# Patient Record
Sex: Female | Born: 1955 | Race: White | Hispanic: No | Marital: Married | Smoking: Never smoker
Health system: Southern US, Community
[De-identification: ages and names within clinical notes are randomized; demographics above are authoritative.]

## PROBLEM LIST (undated history)

## (undated) DIAGNOSIS — M8580 Other specified disorders of bone density and structure, unspecified site: Secondary | ICD-10-CM

## (undated) DIAGNOSIS — K219 Gastro-esophageal reflux disease without esophagitis: Secondary | ICD-10-CM

## (undated) DIAGNOSIS — E785 Hyperlipidemia, unspecified: Secondary | ICD-10-CM

## (undated) DIAGNOSIS — H269 Unspecified cataract: Secondary | ICD-10-CM

## (undated) DIAGNOSIS — I10 Essential (primary) hypertension: Secondary | ICD-10-CM

## (undated) DIAGNOSIS — R011 Cardiac murmur, unspecified: Secondary | ICD-10-CM

## (undated) DIAGNOSIS — J302 Other seasonal allergic rhinitis: Secondary | ICD-10-CM

## (undated) HISTORY — DX: Other specified disorders of bone density and structure, unspecified site: M85.80

## (undated) HISTORY — DX: Gastro-esophageal reflux disease without esophagitis: K21.9

## (undated) HISTORY — DX: Cardiac murmur, unspecified: R01.1

## (undated) HISTORY — DX: Other seasonal allergic rhinitis: J30.2

## (undated) HISTORY — PX: TONSILLECTOMY: SHX5217

## (undated) HISTORY — DX: Unspecified cataract: H26.9

## (undated) HISTORY — DX: Essential (primary) hypertension: I10

## (undated) HISTORY — DX: Hyperlipidemia, unspecified: E78.5

---

## 1998-06-07 ENCOUNTER — Ambulatory Visit (HOSPITAL_COMMUNITY): Admission: RE | Admit: 1998-06-07 | Discharge: 1998-06-07 | Payer: Self-pay | Admitting: *Deleted

## 1998-08-16 ENCOUNTER — Other Ambulatory Visit: Admission: RE | Admit: 1998-08-16 | Discharge: 1998-08-16 | Payer: Self-pay | Admitting: *Deleted

## 1999-06-13 ENCOUNTER — Encounter: Payer: Self-pay | Admitting: *Deleted

## 1999-06-13 ENCOUNTER — Ambulatory Visit (HOSPITAL_COMMUNITY): Admission: RE | Admit: 1999-06-13 | Discharge: 1999-06-13 | Payer: Self-pay | Admitting: *Deleted

## 1999-06-16 ENCOUNTER — Encounter: Payer: Self-pay | Admitting: *Deleted

## 1999-06-16 ENCOUNTER — Ambulatory Visit (HOSPITAL_COMMUNITY): Admission: RE | Admit: 1999-06-16 | Discharge: 1999-06-16 | Payer: Self-pay | Admitting: *Deleted

## 1999-10-26 ENCOUNTER — Other Ambulatory Visit: Admission: RE | Admit: 1999-10-26 | Discharge: 1999-10-26 | Payer: Self-pay | Admitting: *Deleted

## 2000-01-02 ENCOUNTER — Ambulatory Visit (HOSPITAL_COMMUNITY): Admission: RE | Admit: 2000-01-02 | Discharge: 2000-01-02 | Payer: Self-pay | Admitting: Family Medicine

## 2000-01-02 ENCOUNTER — Encounter: Payer: Self-pay | Admitting: Family Medicine

## 2000-06-22 ENCOUNTER — Encounter: Payer: Self-pay | Admitting: *Deleted

## 2000-06-22 ENCOUNTER — Ambulatory Visit (HOSPITAL_COMMUNITY): Admission: RE | Admit: 2000-06-22 | Discharge: 2000-06-22 | Payer: Self-pay | Admitting: *Deleted

## 2000-10-25 ENCOUNTER — Other Ambulatory Visit: Admission: RE | Admit: 2000-10-25 | Discharge: 2000-10-25 | Payer: Self-pay | Admitting: *Deleted

## 2000-11-13 HISTORY — PX: VAGINAL HYSTERECTOMY: SHX2639

## 2001-07-02 ENCOUNTER — Encounter: Payer: Self-pay | Admitting: *Deleted

## 2001-07-02 ENCOUNTER — Ambulatory Visit (HOSPITAL_COMMUNITY): Admission: RE | Admit: 2001-07-02 | Discharge: 2001-07-02 | Payer: Self-pay | Admitting: *Deleted

## 2001-10-28 ENCOUNTER — Other Ambulatory Visit: Admission: RE | Admit: 2001-10-28 | Discharge: 2001-10-28 | Payer: Self-pay | Admitting: Obstetrics and Gynecology

## 2002-06-05 ENCOUNTER — Ambulatory Visit (HOSPITAL_COMMUNITY): Admission: RE | Admit: 2002-06-05 | Discharge: 2002-06-05 | Payer: Self-pay | Admitting: Obstetrics and Gynecology

## 2002-06-05 ENCOUNTER — Encounter: Payer: Self-pay | Admitting: Obstetrics and Gynecology

## 2002-06-17 ENCOUNTER — Observation Stay (HOSPITAL_COMMUNITY): Admission: RE | Admit: 2002-06-17 | Discharge: 2002-06-18 | Payer: Self-pay | Admitting: Obstetrics and Gynecology

## 2002-06-17 ENCOUNTER — Encounter (INDEPENDENT_AMBULATORY_CARE_PROVIDER_SITE_OTHER): Payer: Self-pay | Admitting: Specialist

## 2002-07-09 ENCOUNTER — Encounter: Payer: Self-pay | Admitting: *Deleted

## 2002-07-09 ENCOUNTER — Ambulatory Visit (HOSPITAL_COMMUNITY): Admission: RE | Admit: 2002-07-09 | Discharge: 2002-07-09 | Payer: Self-pay | Admitting: *Deleted

## 2003-04-22 ENCOUNTER — Emergency Department (HOSPITAL_COMMUNITY): Admission: EM | Admit: 2003-04-22 | Discharge: 2003-04-23 | Payer: Self-pay | Admitting: Emergency Medicine

## 2003-07-22 ENCOUNTER — Encounter: Payer: Self-pay | Admitting: *Deleted

## 2003-07-22 ENCOUNTER — Ambulatory Visit (HOSPITAL_COMMUNITY): Admission: RE | Admit: 2003-07-22 | Discharge: 2003-07-22 | Payer: Self-pay | Admitting: *Deleted

## 2004-04-27 ENCOUNTER — Emergency Department (HOSPITAL_COMMUNITY): Admission: EM | Admit: 2004-04-27 | Discharge: 2004-04-27 | Payer: Self-pay | Admitting: Family Medicine

## 2004-04-27 IMAGING — CR DG FOOT COMPLETE 3+V*L*
3 series · 3 of 3 positions shown · non-contrast
Comparison: none

CLINICAL DATA: Foot pain.
 LEFT FOOT, THREE-VIEW

[view not recorded (1 of 3)]
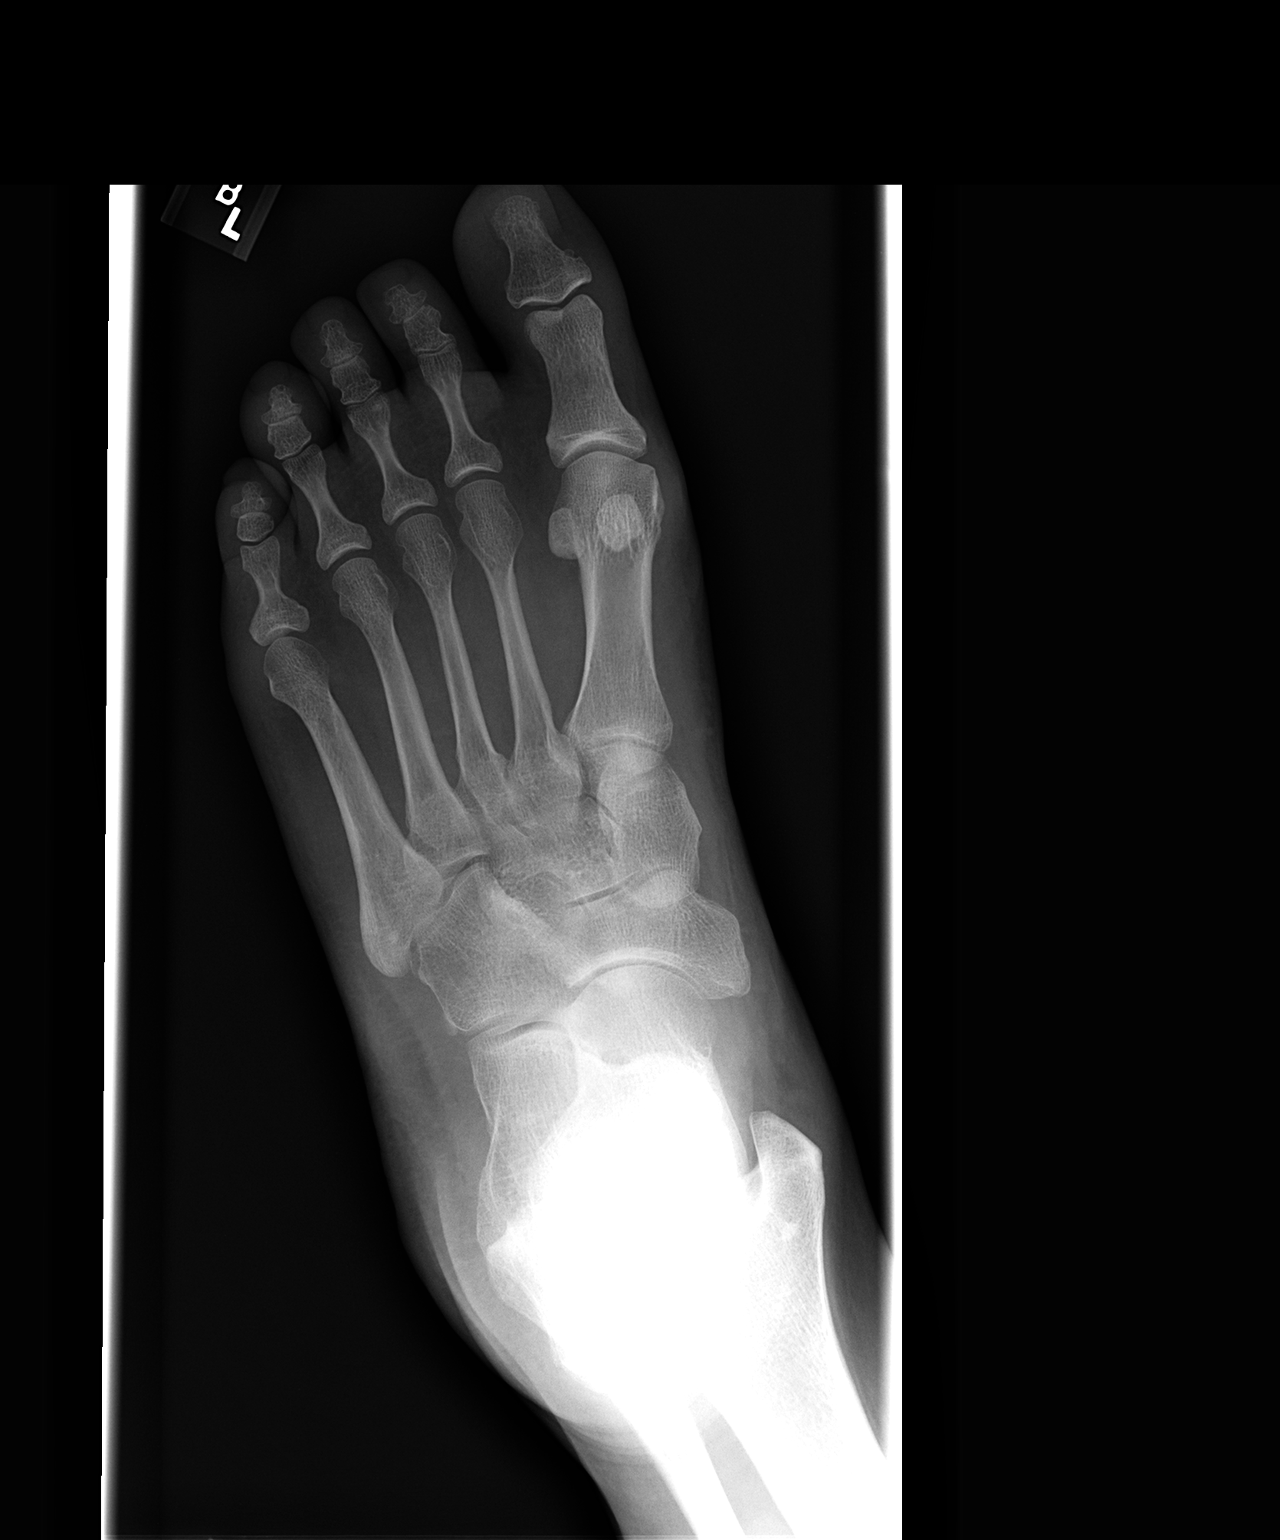

[view not recorded (2 of 3)]
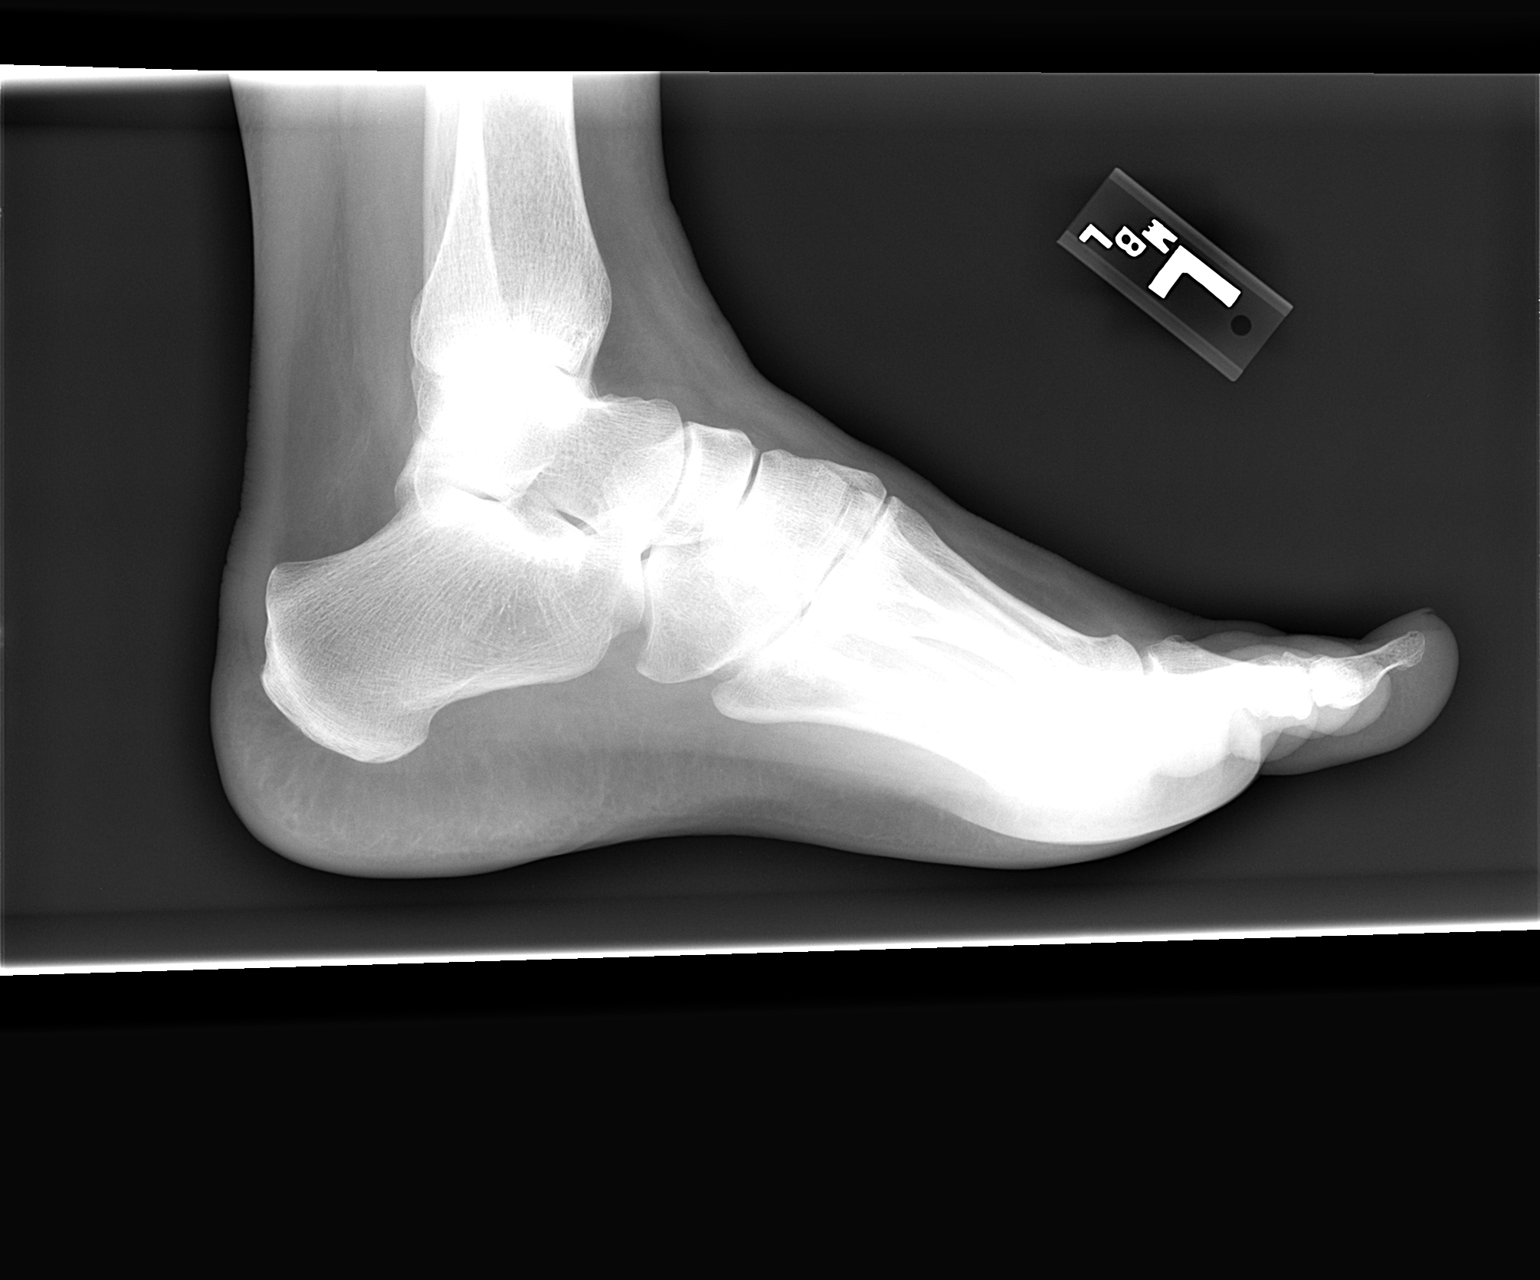

[view not recorded (3 of 3)]
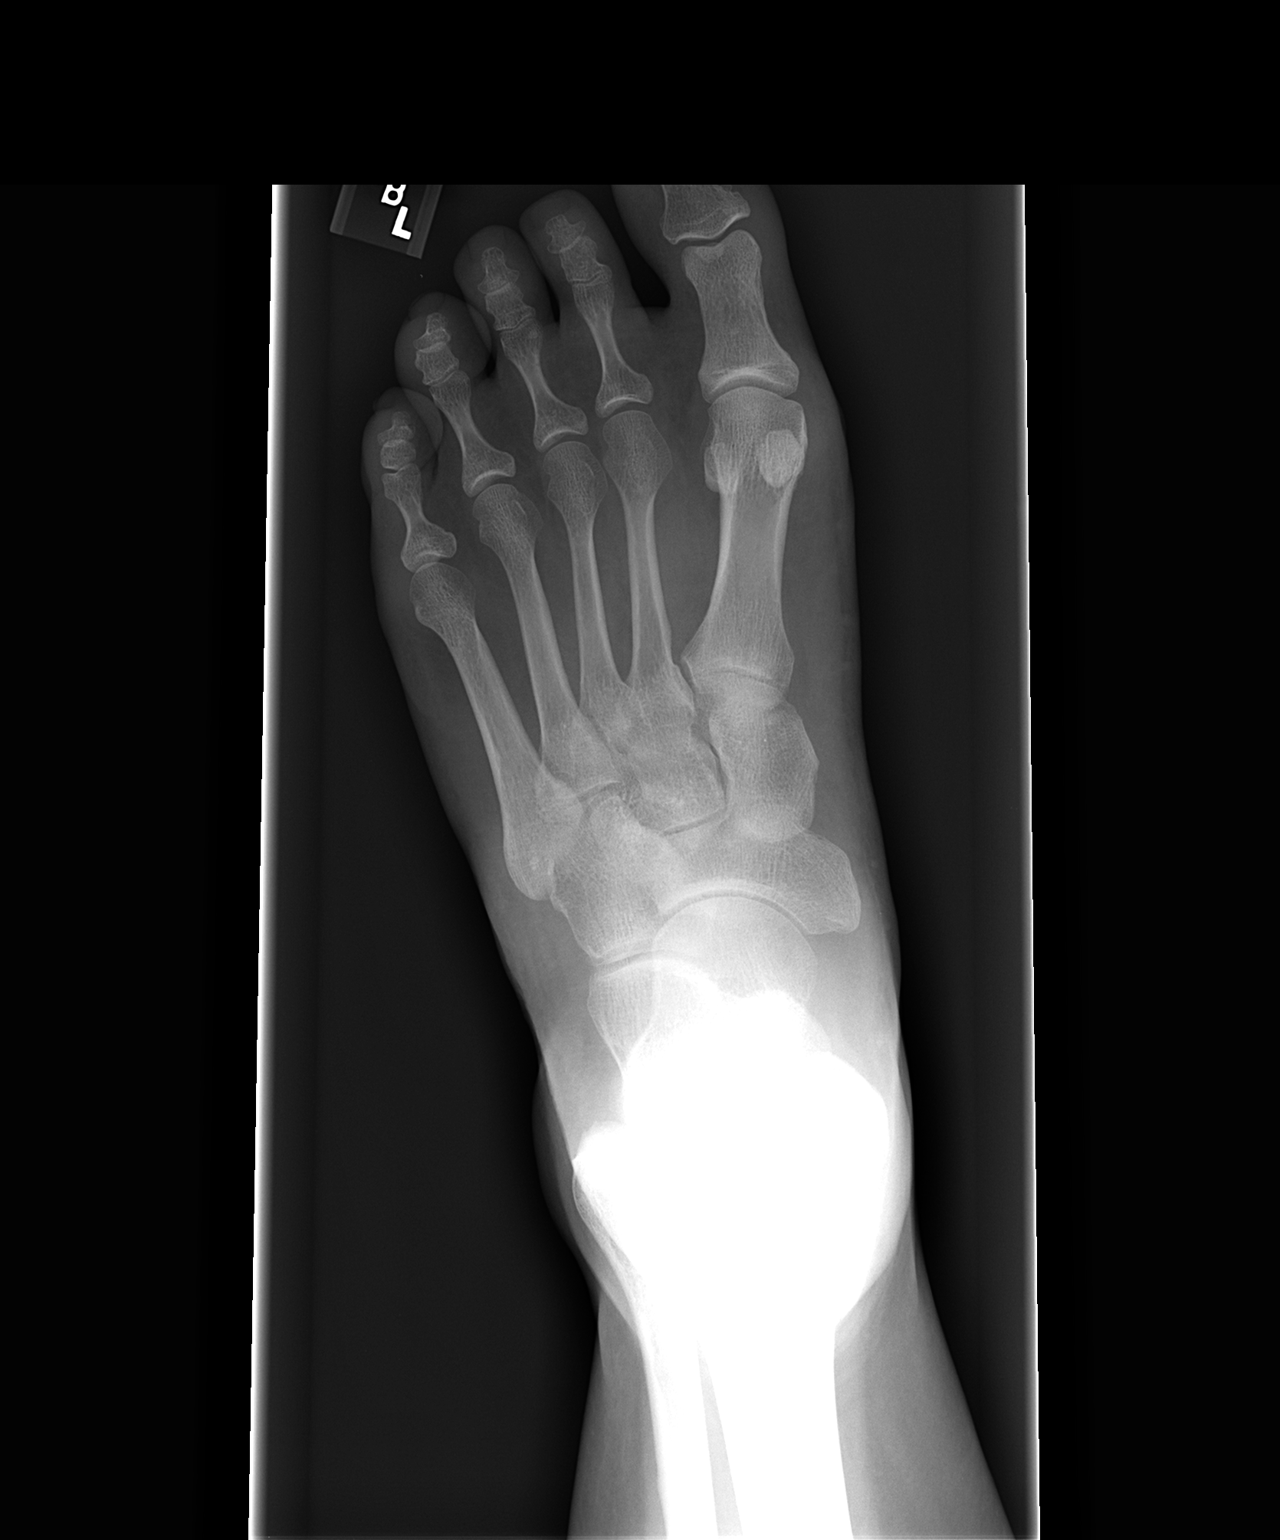

[3 of 3 positions shown; findings below may reference images not displayed]

FINDINGS: There is no evidence of fracture or dislocation.  No other significant bone or soft tissue abnormalities are identified.  The joint spaces are within normal limits.
 IMPRESSION
 Normal study.

## 2004-08-05 ENCOUNTER — Ambulatory Visit (HOSPITAL_COMMUNITY): Admission: RE | Admit: 2004-08-05 | Discharge: 2004-08-05 | Payer: Self-pay | Admitting: *Deleted

## 2005-08-31 ENCOUNTER — Ambulatory Visit (HOSPITAL_COMMUNITY): Admission: RE | Admit: 2005-08-31 | Discharge: 2005-08-31 | Payer: Self-pay | Admitting: *Deleted

## 2005-12-12 ENCOUNTER — Other Ambulatory Visit: Admission: RE | Admit: 2005-12-12 | Discharge: 2005-12-12 | Payer: Self-pay | Admitting: Family Medicine

## 2006-08-30 ENCOUNTER — Ambulatory Visit (HOSPITAL_COMMUNITY): Admission: RE | Admit: 2006-08-30 | Discharge: 2006-08-30 | Payer: Self-pay | Admitting: Family Medicine

## 2006-08-30 IMAGING — MG MM DIGITAL SCREENING BILAT
4 series · 4 of 4 positions shown · non-contrast
Comparison: none

DG SCREEN MAMMOGRAM BILATERAL
Bilateral CC and MLO view(s) were taken.

DIGITAL SCREENING MAMMOGRAM WITH CAD:
There is a dense fibroglandular pattern.  A possible mass is noted in the right breast.  Spot 
compression views and possibly sonography are recommended for further evaluation.  In the left 
breast, no masses or malignant type calcifications are identified.  Compared with prior studies.

[R CC]
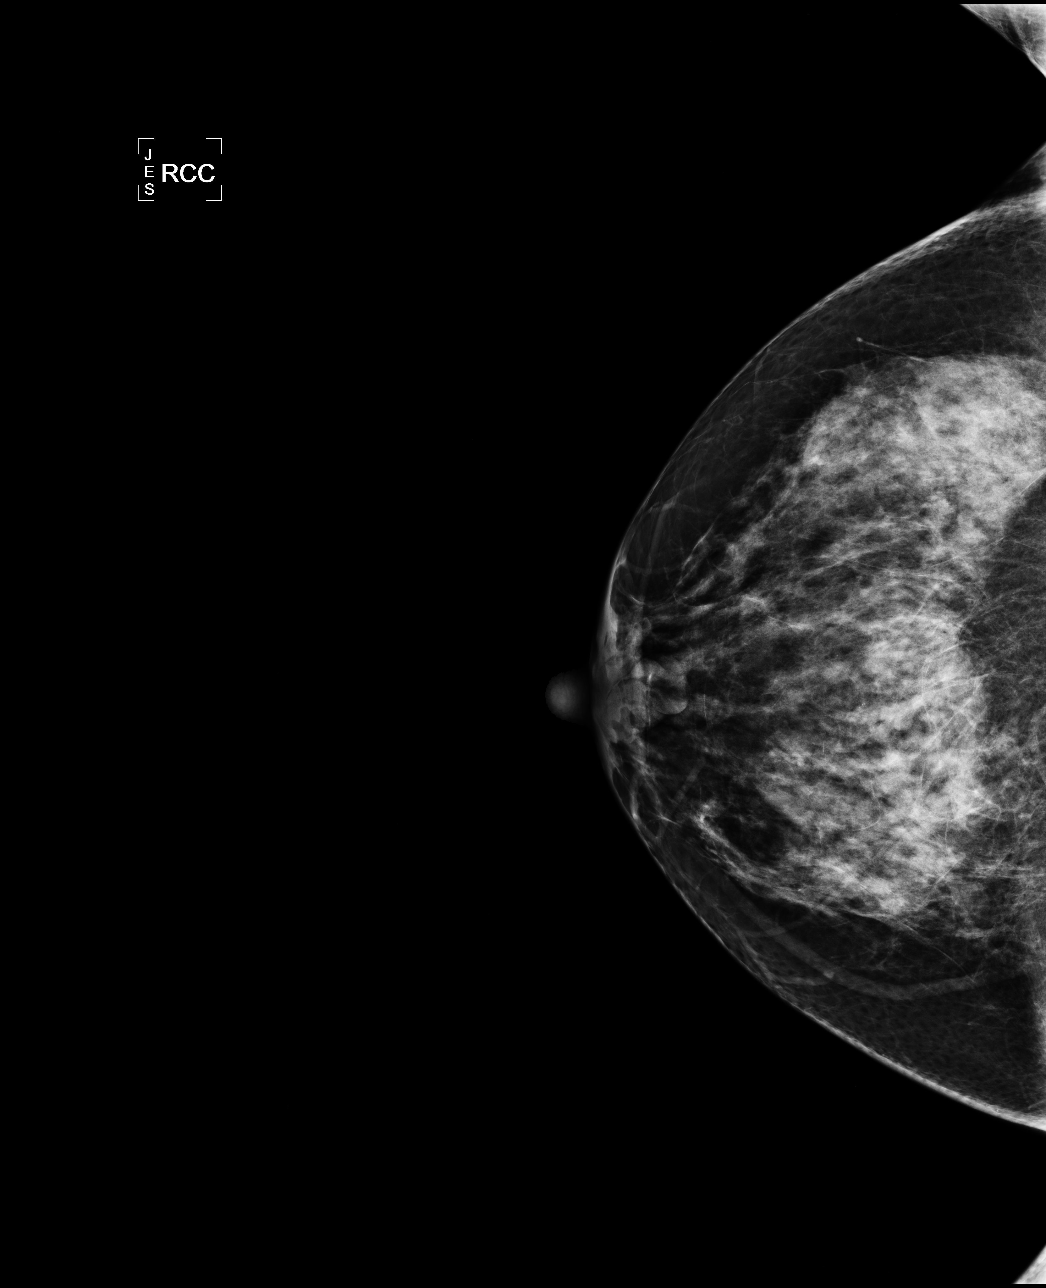

[R MLO]
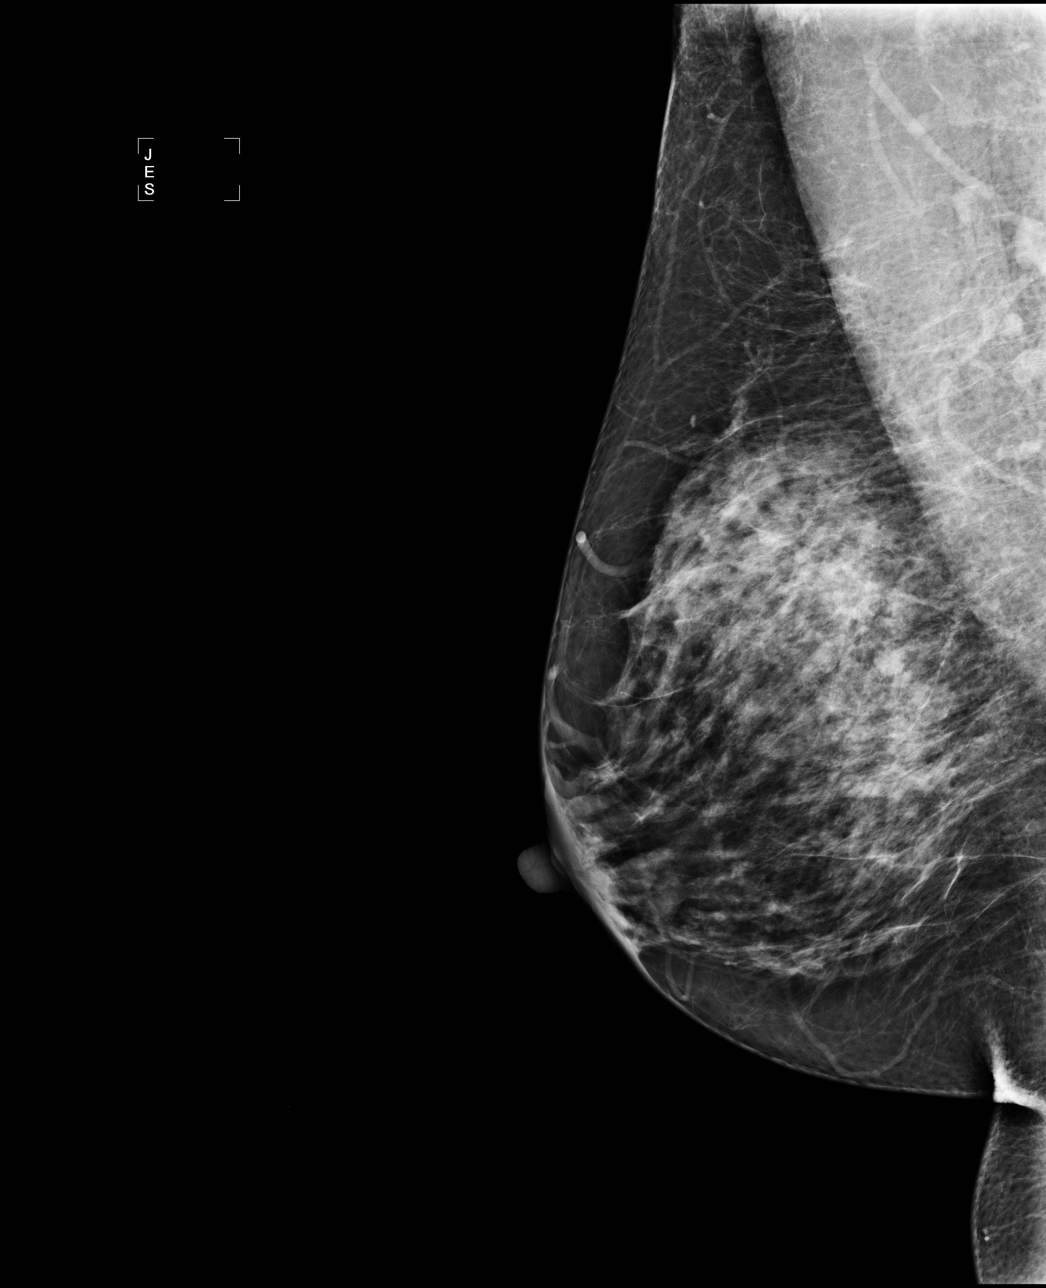

[L CC]
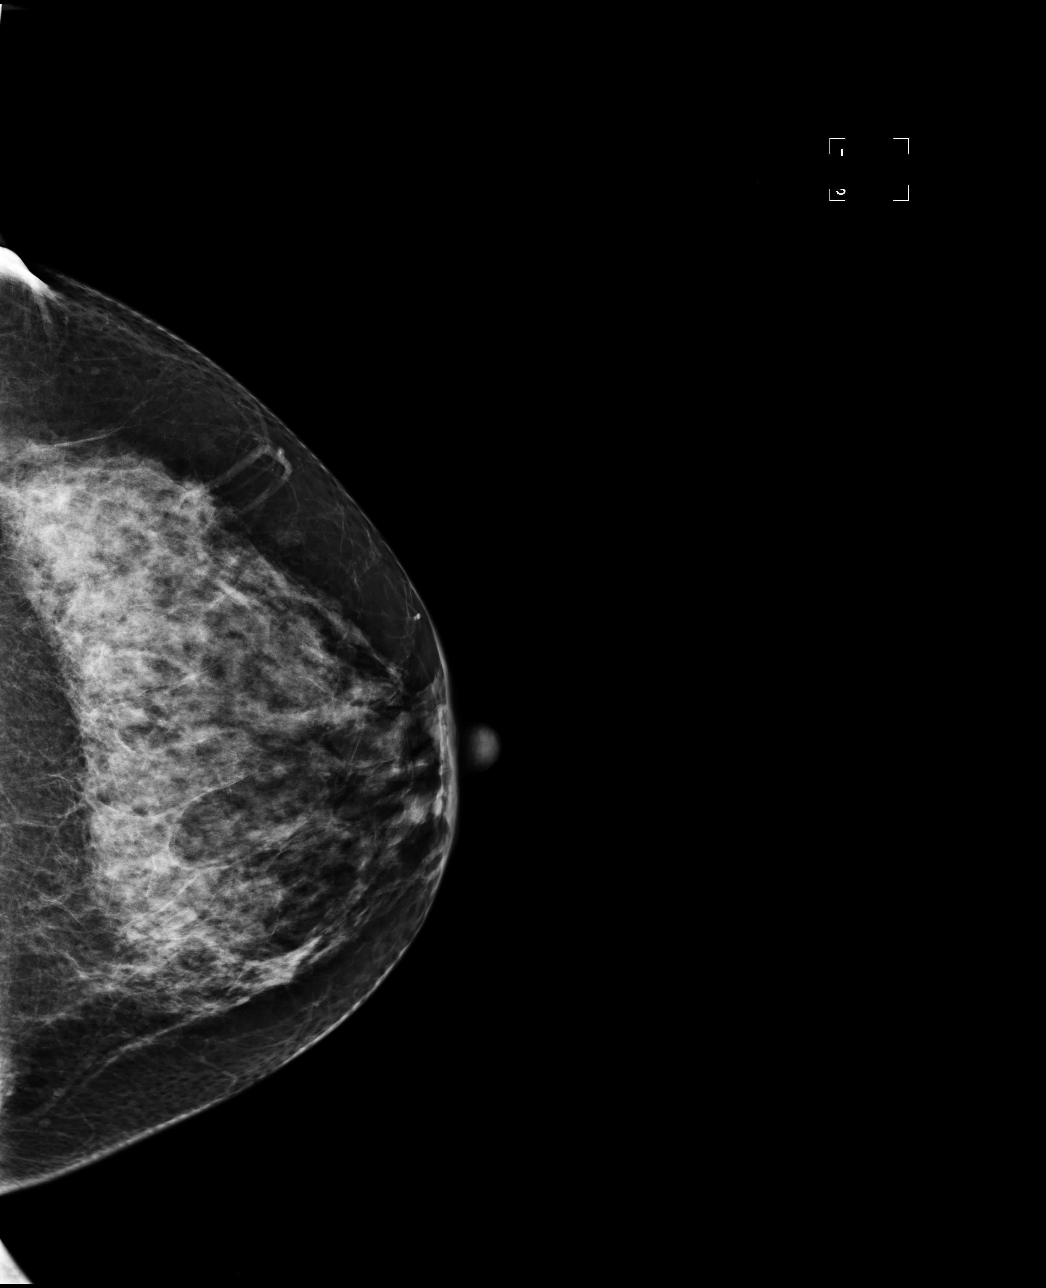

[L MLO]
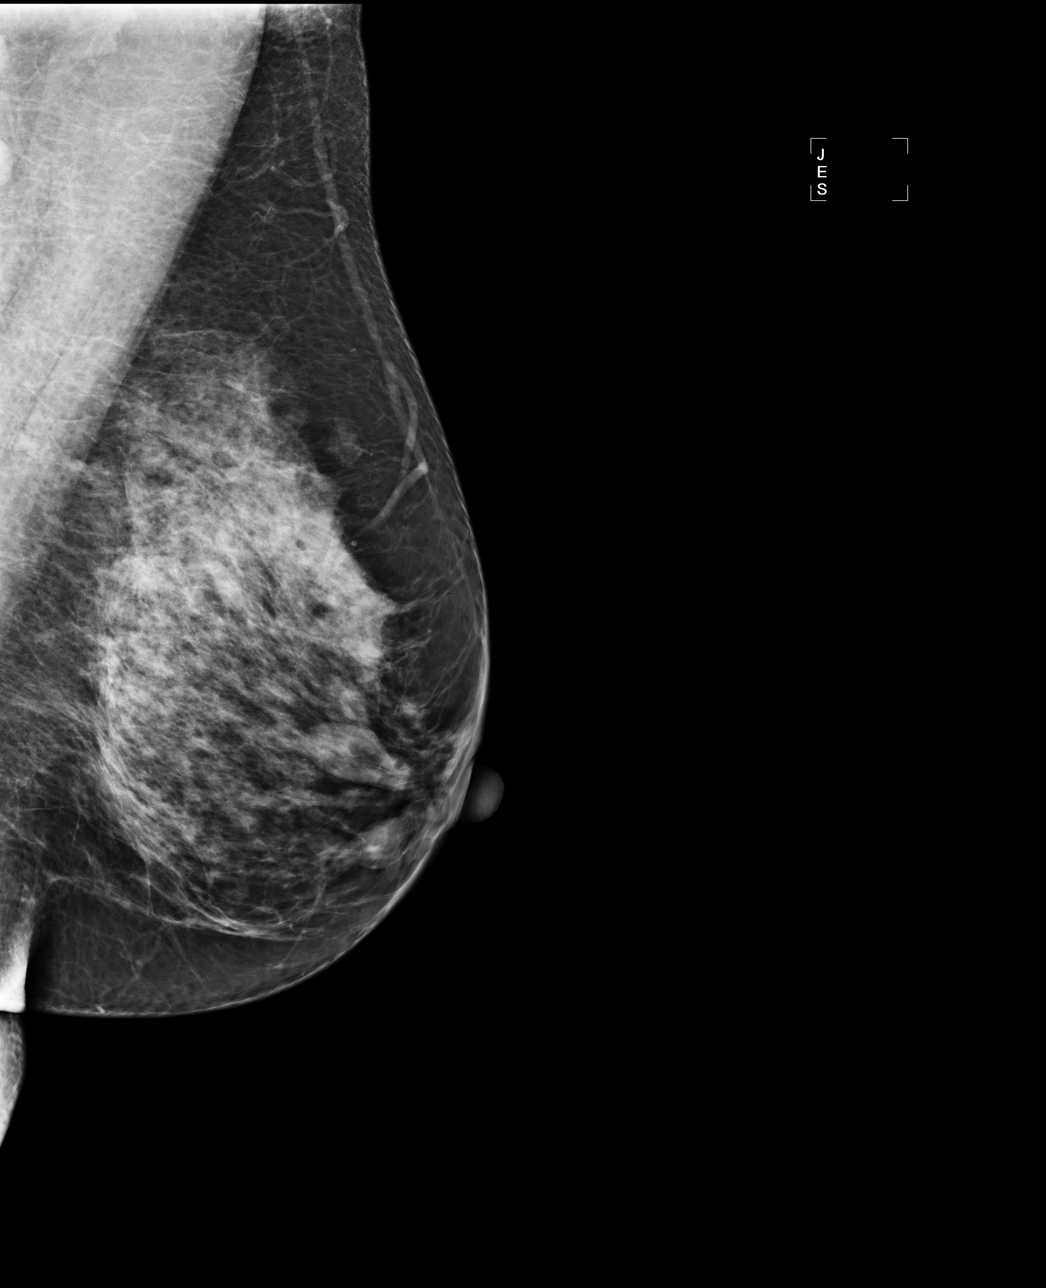

[4 of 4 positions shown; findings below may reference images not displayed]

IMPRESSION: Possible mass, right breast.  Additional evaluation is indicated.  The patient will be contacted 
for additional studies and a supplementary report will follow.  No specific mammographic evidence 
of malignancy, left breast.

ASSESSMENT: Need additional imaging evaluation and/or prior mammograms for comparison - BI-RADS 0

Further imaging of the right breast.
ANALYZED BY COMPUTER AIDED DETECTION. , THIS PROCEDURE WAS A DIGITAL MAMMOGRAM.

## 2006-09-13 ENCOUNTER — Encounter: Admission: RE | Admit: 2006-09-13 | Discharge: 2006-09-13 | Payer: Self-pay | Admitting: Family Medicine

## 2006-09-13 IMAGING — MG MM DIAGNOSTIC LTD RIGHT
2 series · 2 of 2 positions shown · non-contrast
Comparison: none

[REDACTED] RIGHT
CC and MLO view(s) were taken of the right breast.

DIGITAL LIMITED RIGHT DIAGNOSTIC MAMMOGRAM:
CLINICAL DATA: Abnormal screening mammogram; dense tissue and strong family history of breast 
cancer in the patient's mother at age 52 and sister at age 46.
Comparison studies are dated [DATE] and [DATE].
Additional views reveal no persistent mass or distortion in the upper inner right breast. The dense
parenchymal pattern is stable.

[R CC]
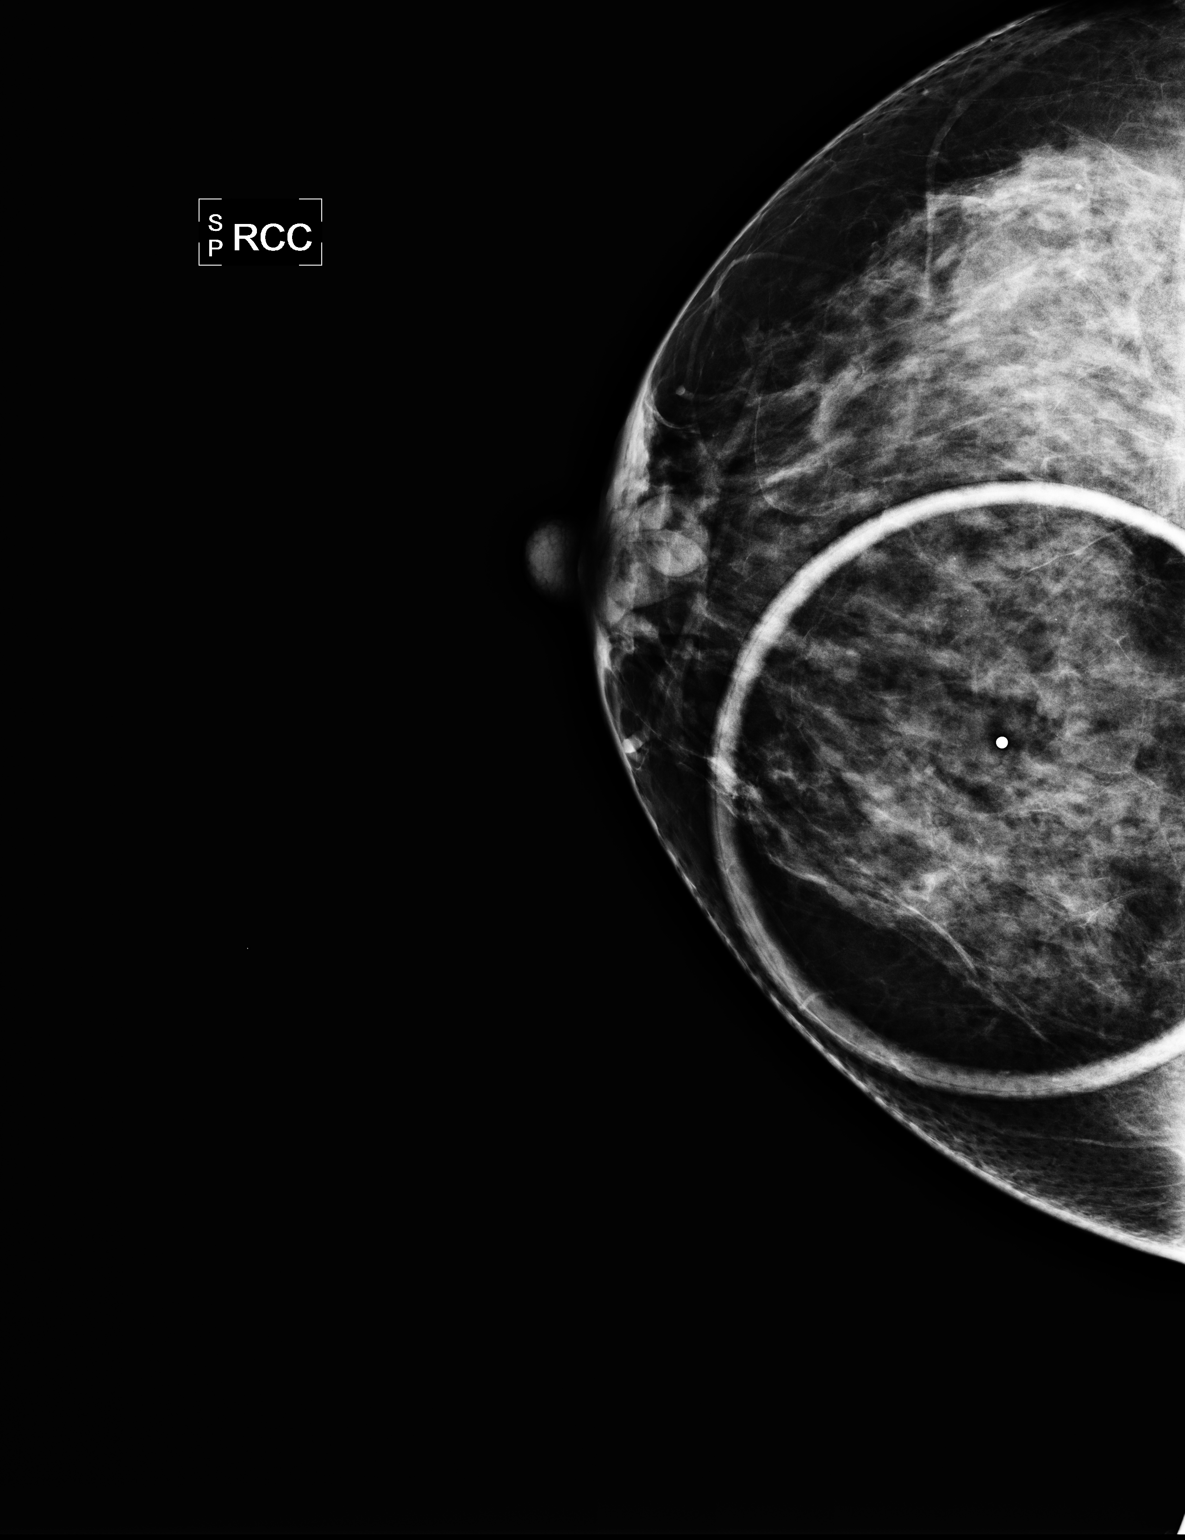

[R MLO]
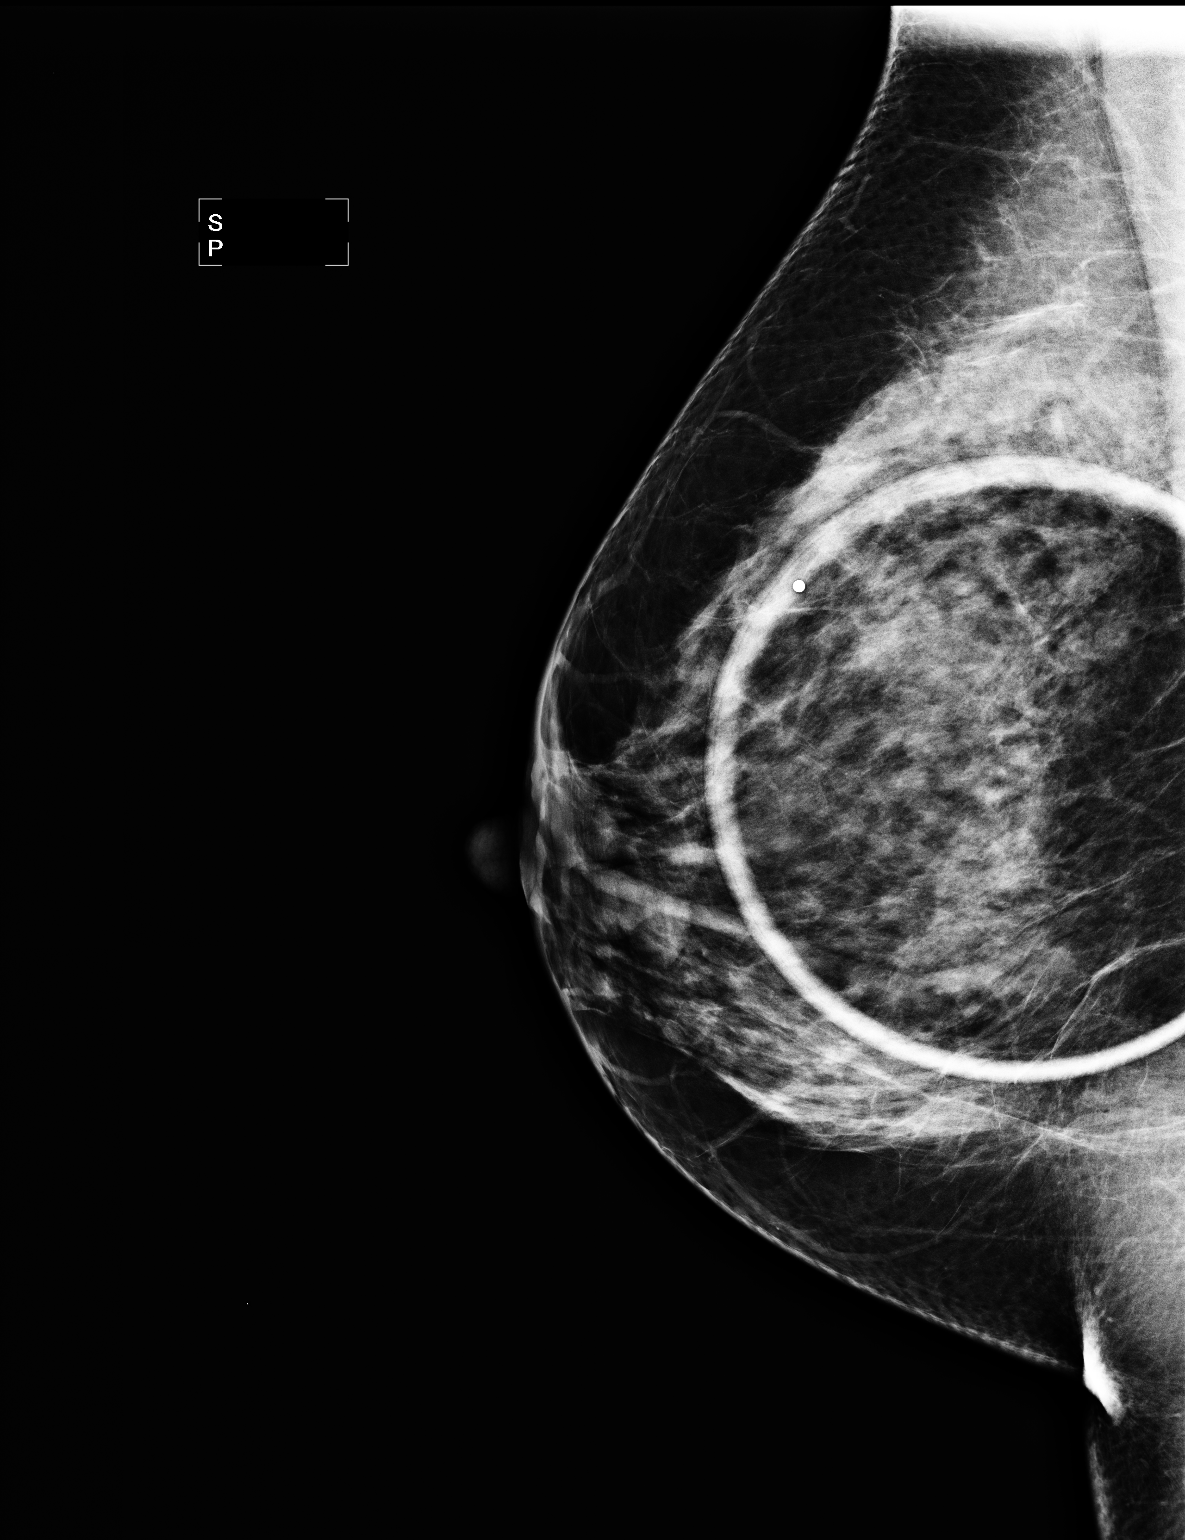

[2 of 2 positions shown; findings below may reference images not displayed]

IMPRESSION: There is no specific radiographic evidence for malignancy on the right. Screening mammogram in one 
year is recommended. Additional screening with MRI every one-two years should also be considered 
because of the strong family history of breast cancer and extremely dense tissue pattern.

ASSESSMENT: Benign - BI-RADS 2

Screening mammogram of both breasts in 1 year.
, THIS PROCEDURE WAS A DIGITAL MAMMOGRAM.

## 2007-09-24 ENCOUNTER — Ambulatory Visit (HOSPITAL_COMMUNITY): Admission: RE | Admit: 2007-09-24 | Discharge: 2007-09-24 | Payer: Self-pay | Admitting: Family Medicine

## 2008-09-24 ENCOUNTER — Ambulatory Visit (HOSPITAL_COMMUNITY): Admission: RE | Admit: 2008-09-24 | Discharge: 2008-09-24 | Payer: Self-pay | Admitting: Family Medicine

## 2008-09-24 IMAGING — MG MM DIGITAL SCREENING BILAT
4 series · 4 of 4 positions shown · non-contrast
Comparison: none

DG SCREEN MAMMOGRAM BILATERAL
Bilateral CC and MLO view(s) were taken.
Technologist: SERPA.(SERPA)(M)

DIGITAL SCREENING MAMMOGRAM WITH CAD:
The breast tissue is extremely dense.  No masses or malignant type calcifications are identified.  
Compared with prior studies.

[R CC]
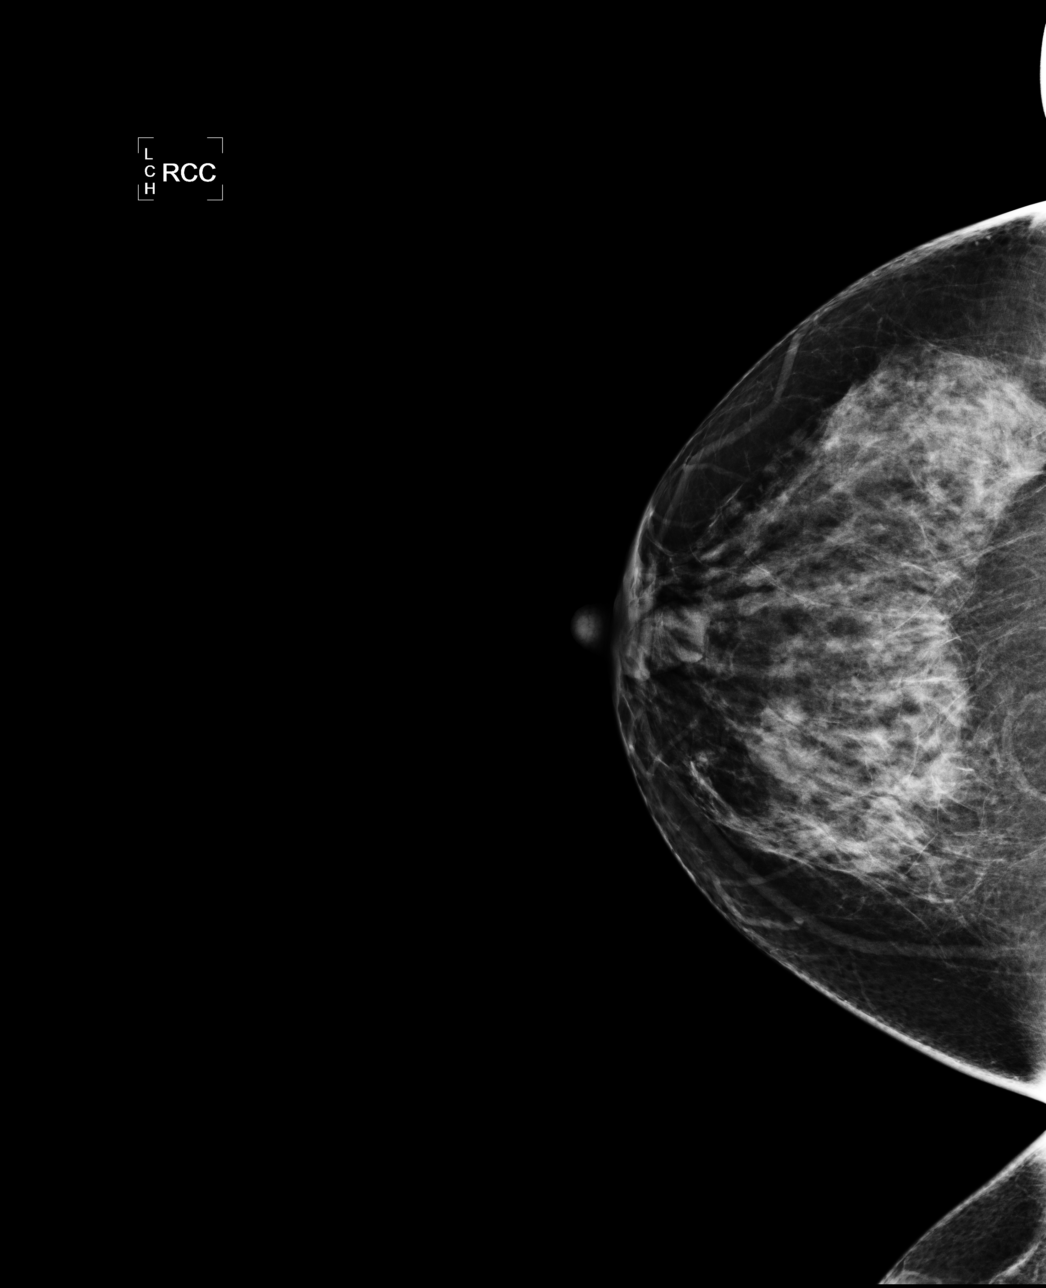

[R MLO]
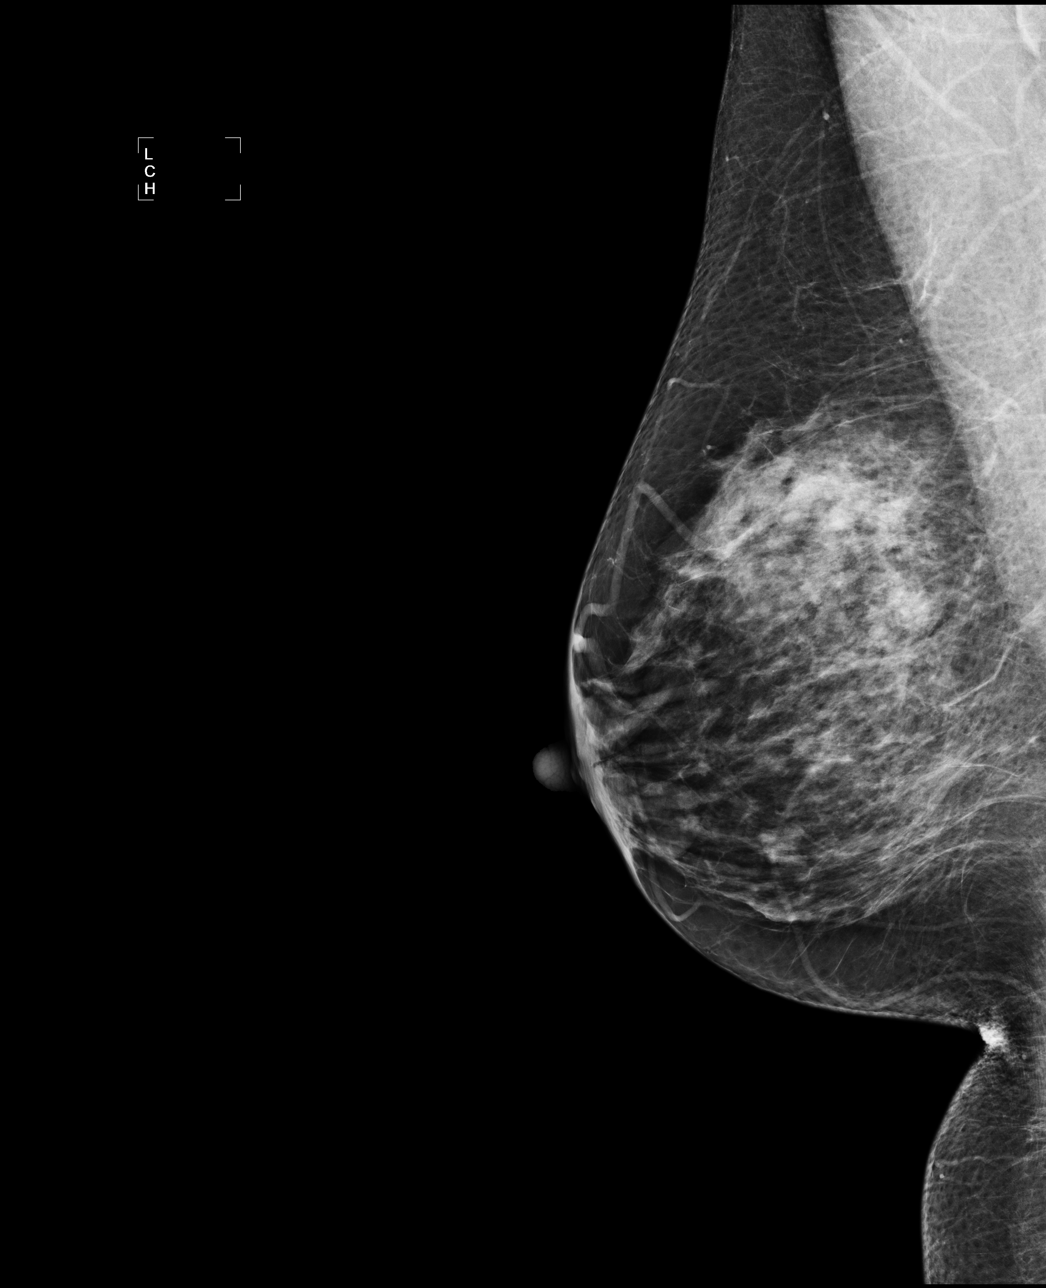

[L CC]
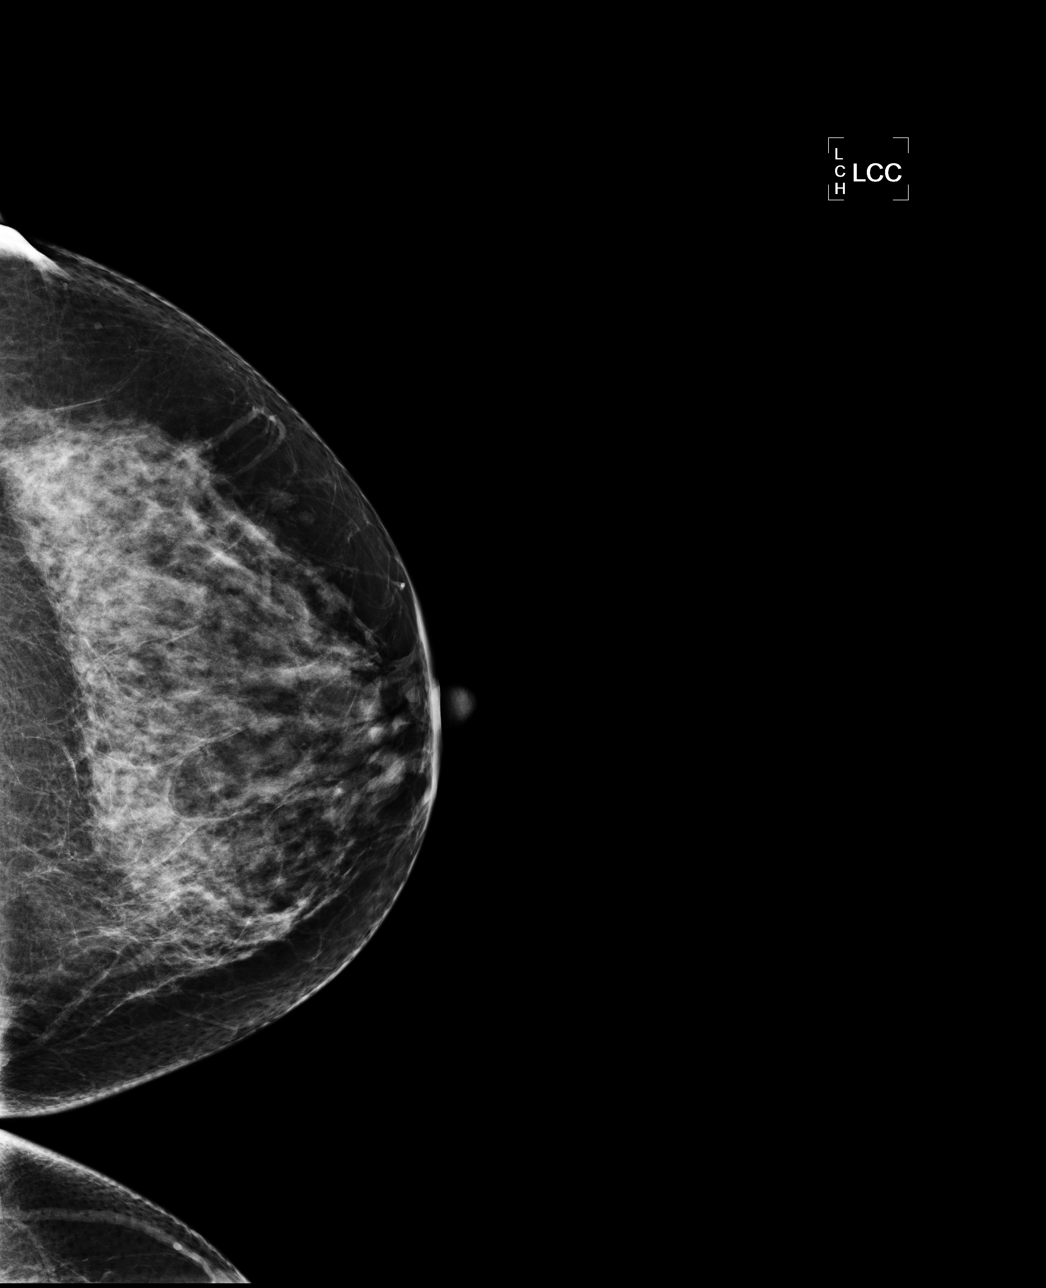

[L MLO]
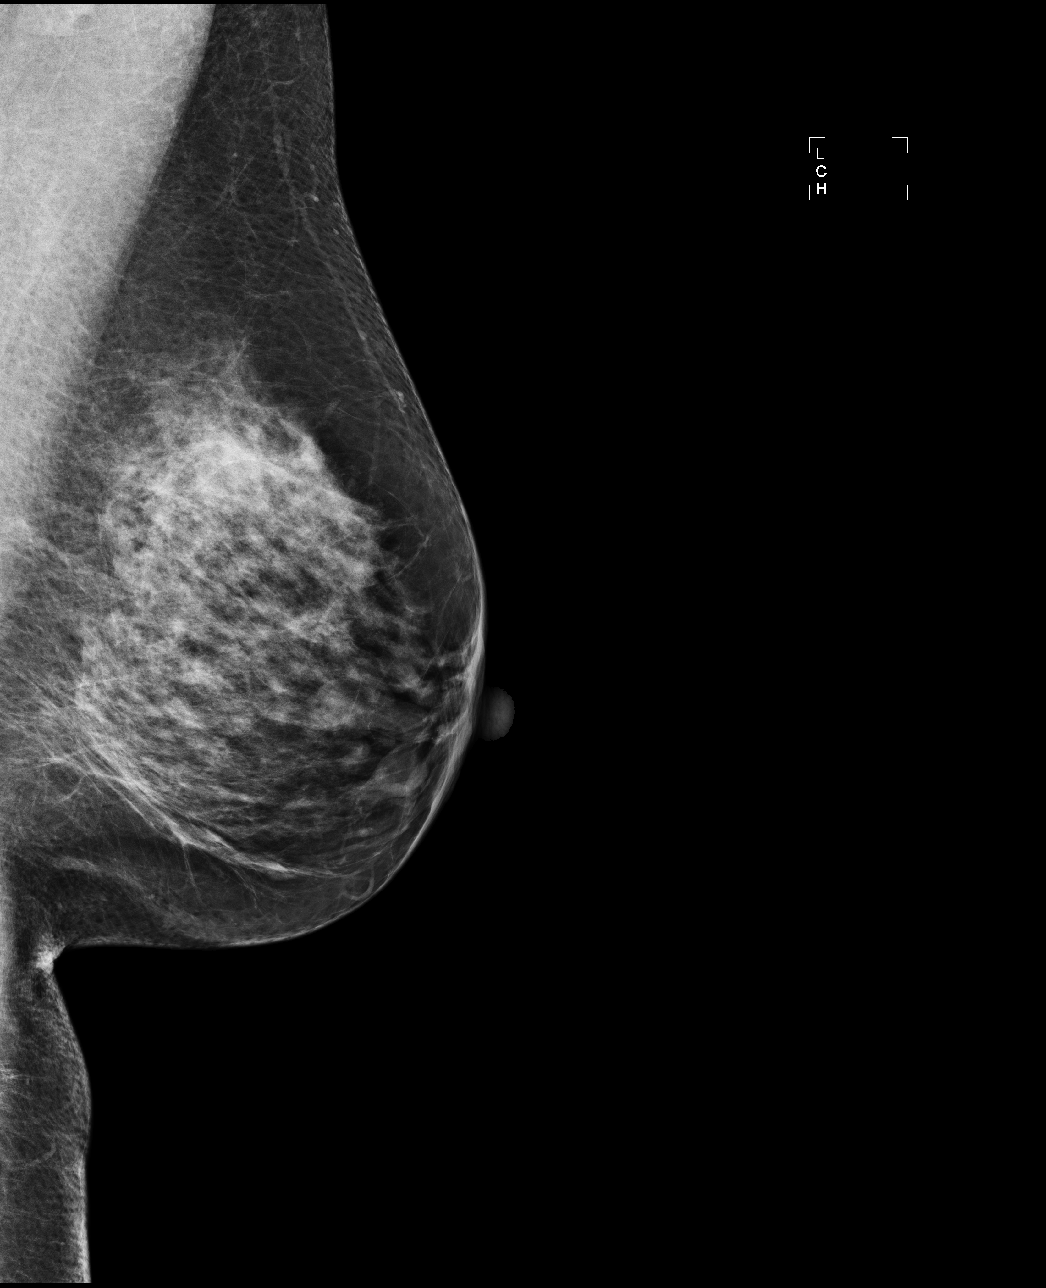

[4 of 4 positions shown; findings below may reference images not displayed]

IMPRESSION: No specific mammographic evidence of malignancy.  Next screening mammogram is recommended in one 
year.

ASSESSMENT: Negative - BI-RADS 1

Screening mammogram in 1 year.
ANALYZED BY COMPUTER AIDED DETECTION. , THIS PROCEDURE WAS A DIGITAL MAMMOGRAM.

## 2010-11-13 HISTORY — PX: CHOLECYSTECTOMY: SHX55

## 2010-12-04 ENCOUNTER — Encounter: Payer: Self-pay | Admitting: Family Medicine

## 2011-03-31 NOTE — Op Note (Signed)
[name], Carolyn                       ACCOUNT NO.:  [id number]   MEDICAL RECORD NO.:  [id number]                   PATIENT TYPE:  OUT   LOCATION:  XRAY                                 FACILITY:  [facility]   PHYSICIAN:  Laqueta Linden, M.D.                 DATE OF BIRTH:  07-Apr-1956   DATE OF PROCEDURE:  06/17/2002  DATE OF DISCHARGE:  06/05/2002                                 OPERATIVE REPORT   PREOPERATIVE DIAGNOSES:  1. Intramural fibroids.  2. Menorrhagia and dysmenorrhea.   POSTOPERATIVE DIAGNOSES:  1. Intramural fibroids.  2. Menorrhagia and dysmenorrhea.  3. Mild serosal endometriosis.   PROCEDURE:  Transvaginal hysterectomy.   SURGEON:  Laqueta Linden, M.D.   ASSISTANT:  Andres Ege, M.D.   ANESTHESIA:  General endotracheal.   ESTIMATED BLOOD LOSS:  200 cc.   URINE OUTPUT:  100 cc catheterized in-and-out at the conclusion of the  procedure.   FLUIDS:  2 L of crystalloid.   COUNTS:  Correct x2.   COMPLICATIONS:  None.   INDICATIONS:  The patient is a 55 year old gravida 1, para 1 married white  female with a long history of dysmenorrhea and menorrhagia minimally  responsive to oral contraceptive cycling.  Ultrasound revealed multiple  small intramural fibroids.  Sonohysterogram was negative for intrauterine  lesions amenable to conservative therapy.  Endometrial biopsy was negative.  She was offered alternatives including endometrial ablation versus resection  versus continued medical management versus hysterectomy and chose the  latter.  She has seen the informed consent film.  Has been accepted and  counseled as to the risks, benefits, options, and complications of the  procedure including, but not limited to, anesthesia risks, infection,  bleeding possibly requiring transfusion, injury to bowel, bladder, ureter,  vessels, nerves, the possibility of fistula formation, postoperative  expectations regarding the onset of menopause, sexual  functioning, recovery,  and return to work.  She was also aware of risks of DVT, PE, pneumonia, and  death as well as other unknown risks.  She is aware that she has some  increased risk of a postoperative clot due to taking oral contraceptives up  to the day prior to surgery.  This had previously been discussed with the  patient and due to her severe bleeding problems, we elected to leave her on  pills prior to her surgery and be aggressive with early ambulation,  sequential compression stockings, and DVT prophylaxis in that manner.  The  patient has been extensively counseled.  All her questions have been  answered and she agrees to proceed.  She also understands that this will  provide permanent and irreversible sterilization.  The patient is here for  same day surgery.  She has received 1 g of IV antibiotic prophylaxis  preoperatively.  Full consent was given.   PROCEDURE:  The patient was taken to the operating room and after proper  identification and  consents were ascertained, she was placed on the  operative table in the supine position.  After the induction of general  endotracheal anesthesia she was placed in the candy cane stirrups and the  perineum and vagina were prepped and draped in a routine sterile fashion.  The bladder was emptied with a red rubber catheter.  A weighted speculum was  placed in the posterior vagina and the cervix was grasped with a single  tooth tenaculum and advanced almost to the introitus with traction.  The  portio was then injected with a 1:100,000 solution of epinephrine  circumferentially.  The cervix was then circumscribed with a scalpel.  The  vaginal mucosa was then bluntly advanced off of the cervix using a finger  with a Raytek sponge.  The posterior vaginal mucosa was then tented down and  the cul-de-sac entered sharply using curved Mayo scissors.  The uterosacral  ligaments were clamped, cut, Heaney ligated with 0 Vicryl suture, and  tagged.   Cardinal ligaments were similarly clamped, cut, sutured ligated,  and tagged.  Attention was then turned to the anterior peritoneal reflection  which was easily identified and entered sharply without obvious injury or  entry into the bladder.  Uterine vessels were then clamped bilaterally,  incorporating both anterior and posterior peritoneum.  The uterus fundus was  then flipped posteriorly and curved Heaney clamps were placed across the  proximal adnexal pedicles bilaterally with excision of a specimen.  The tube  and ovary on the right appeared completely normal.  There was a slight  amount of tearing of the ovary as it was occluded into the adnexal pedicle.  This pedicle was triply ligated and the ovarian cortex was then closed in a  running suture of 2-0 Vicryl with excellent hemostasis maintained.  There  was a small spot of what appeared to be endometriosis on the serosa of the  fallopian tube on the left.  This was cauterized.  The adnexal pedicle on  the left was then similarly triple ligated with two free ties and a stitch  of 0 Vicryl.  Hemostasis was excellent.  The ovaries were small and  consistent with long-term oral contraceptive use without other  abnormalities.  At this point the posterior vaginal mucosa was then reefed  to the posterior peritoneum with improved hemostasis.  Upper vaginal support  was felt to be excellent.  The parietoperitoneum was then closed in a purse-  string fashion using 2-0 Vicryl suture.  The adnexal pedicles were again  observed and noted to be hemostatic and were released into the peritoneal  cavity.  The fallopian tube on the right had a tendency to drop into the  vaginal cuff and this was gently advanced up into the peritoneal cavity  prior to closure of the peritoneal purse-string suture.  Counts were correct  prior to this closure.  The cardinal ligament and uterosacral ligament tags were then tied in the midline.  The vaginal cuff was then  closed from side  to side using interrupted figure-of-eight sutures of 0 Vicryl.  Hemostasis  is excellent.  The bladder was then emptied of 175 cc of clear urine by in-  and-out catheterization.  All instruments, needles, and sponges were  accounted for.  The patient was returned to the supine position, was  extubated and stable on transfer to the recovery room.   ESTIMATED BLOOD LOSS:  200 cc.   FLUIDS:  2 L crystalloid.   URINE OUTPUT:  175 cc.  COUNTS:  Correct x2.   COMPLICATIONS:  None.                                               Laqueta Linden, M.D.    LKS/MEDQ  D:  06/17/2002  T:  06/20/2002  Job:  [id number]

## 2012-11-13 HISTORY — PX: CATARACT EXTRACTION, BILATERAL: SHX1313

## 2021-08-22 ENCOUNTER — Other Ambulatory Visit: Payer: Self-pay | Admitting: Internal Medicine

## 2021-08-22 DIAGNOSIS — Z1231 Encounter for screening mammogram for malignant neoplasm of breast: Secondary | ICD-10-CM

## 2021-10-10 ENCOUNTER — Other Ambulatory Visit (HOSPITAL_COMMUNITY): Payer: Self-pay | Admitting: Internal Medicine

## 2021-10-10 ENCOUNTER — Ambulatory Visit (HOSPITAL_COMMUNITY)
Admission: RE | Admit: 2021-10-10 | Discharge: 2021-10-10 | Disposition: A | Discharge status: Home / Self Care | Payer: Medicare Other | Source: Ambulatory Visit | Attending: Family Medicine | Admitting: Family Medicine

## 2021-10-10 ENCOUNTER — Other Ambulatory Visit: Payer: Self-pay

## 2021-10-10 DIAGNOSIS — H9313 Tinnitus, bilateral: Secondary | ICD-10-CM | POA: Insufficient documentation

## 2021-10-12 ENCOUNTER — Ambulatory Visit
Admission: RE | Admit: 2021-10-12 | Discharge: 2021-10-12 | Disposition: A | Discharge status: Home / Self Care | Payer: Medicare Other | Source: Ambulatory Visit | Attending: Internal Medicine | Admitting: Internal Medicine

## 2021-10-12 DIAGNOSIS — Z1231 Encounter for screening mammogram for malignant neoplasm of breast: Secondary | ICD-10-CM

## 2021-10-12 IMAGING — MG MM DIGITAL SCREENING BILAT W/ TOMO AND CAD
8 series · 8 of 24 positions shown · non-contrast
Comparison: Previous exam(s).

CLINICAL DATA: Screening.

EXAM:
DIGITAL SCREENING BILATERAL MAMMOGRAM WITH TOMOSYNTHESIS AND CAD
TECHNIQUE: Bilateral screening digital craniocaudal and mediolateral oblique
mammograms were obtained. Bilateral screening digital breast
tomosynthesis was performed. The images were evaluated with
computer-aided detection.

[L MLO synth-2D]
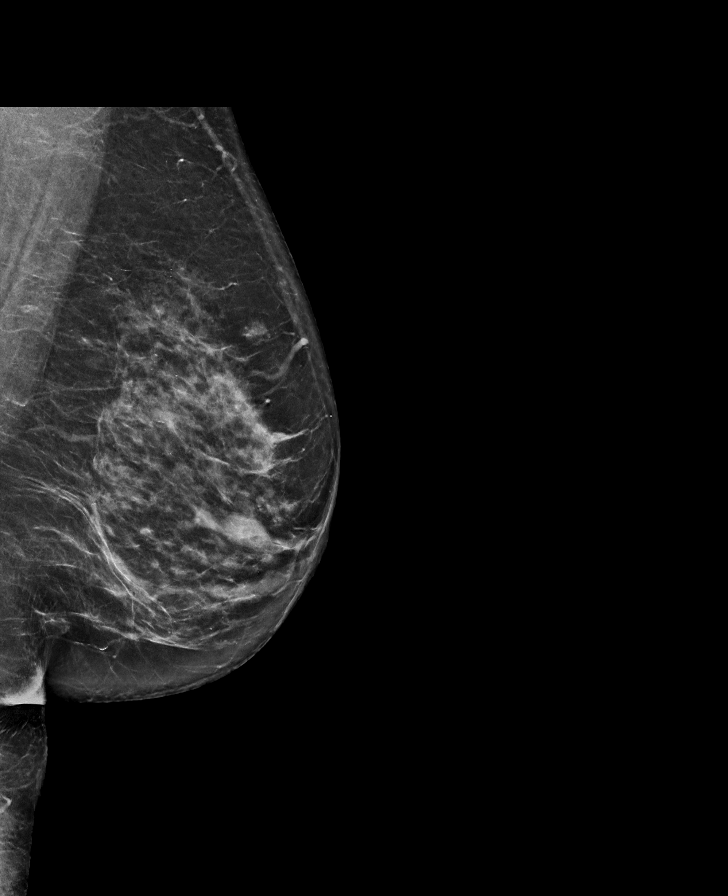

[R MLO synth-2D]
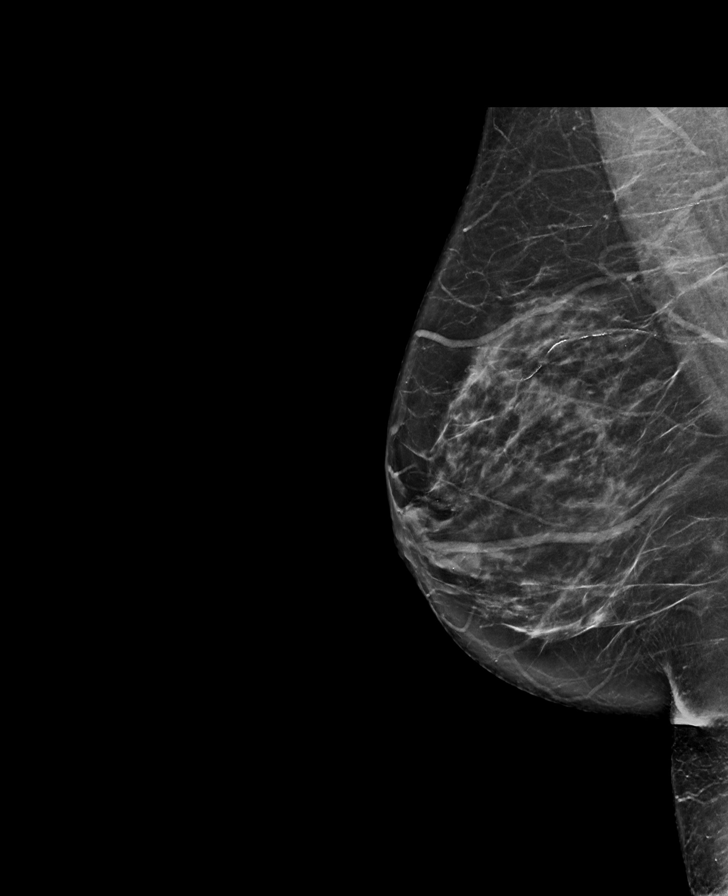

[R CC synth-2D]
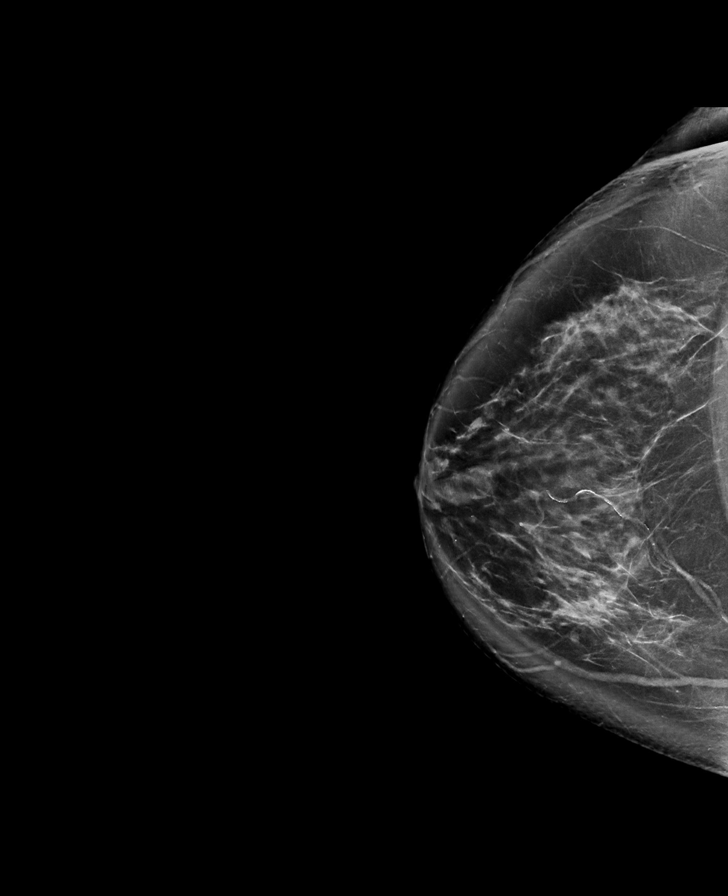

[L CC synth-2D]
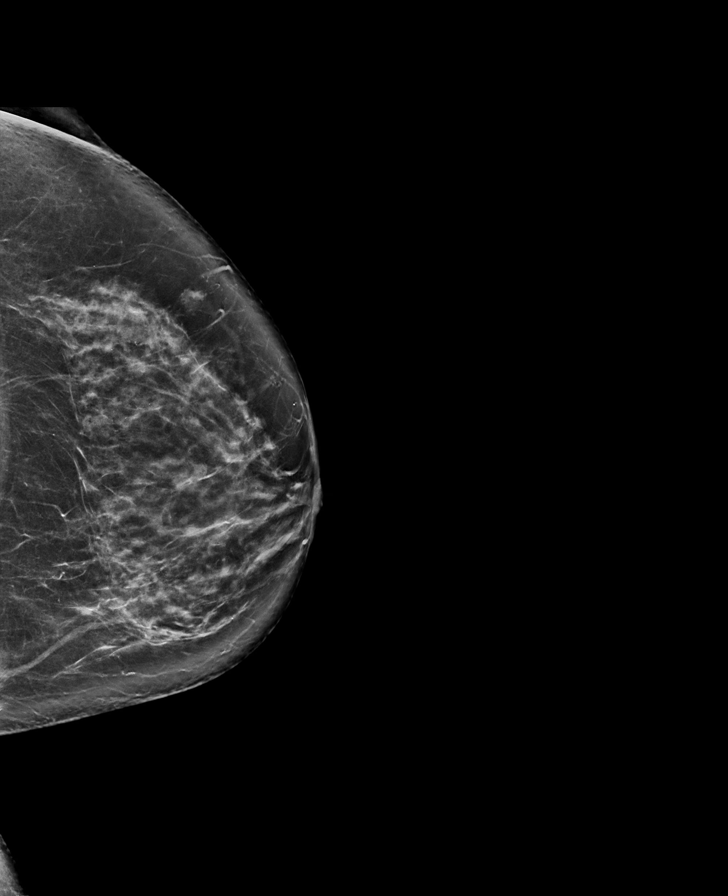

[L MLO tomo · tomo slice 39/77.0]
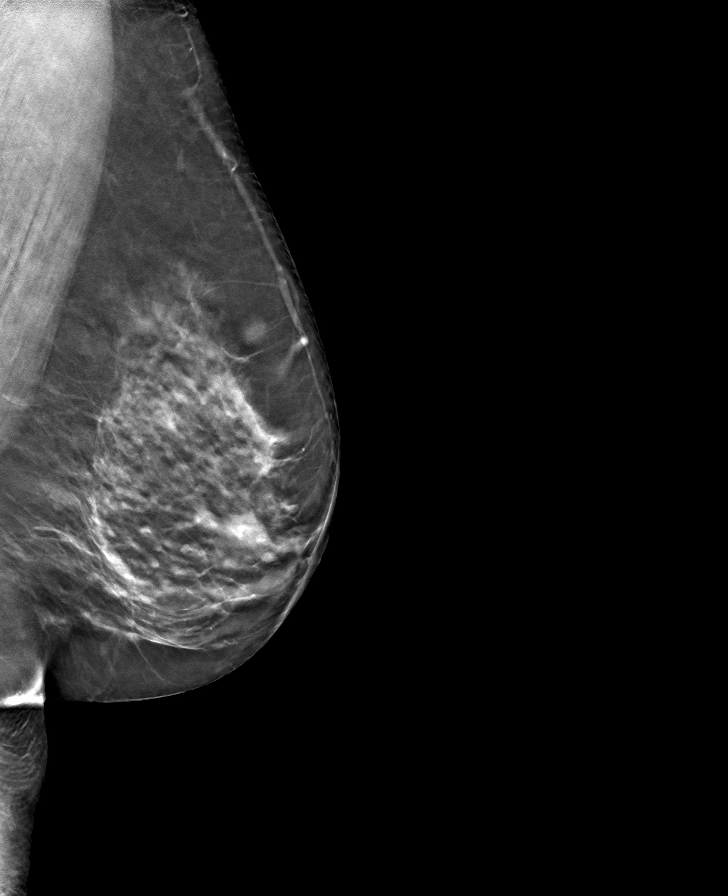

[R CC tomo · tomo slice 46/91.0]
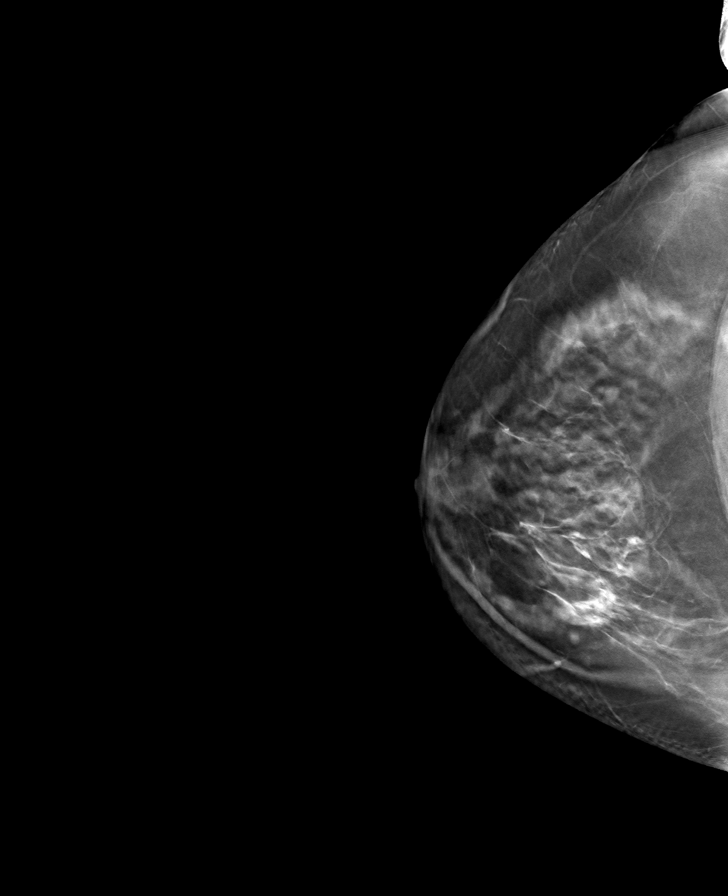

[L CC tomo · tomo slice 42/83.0]
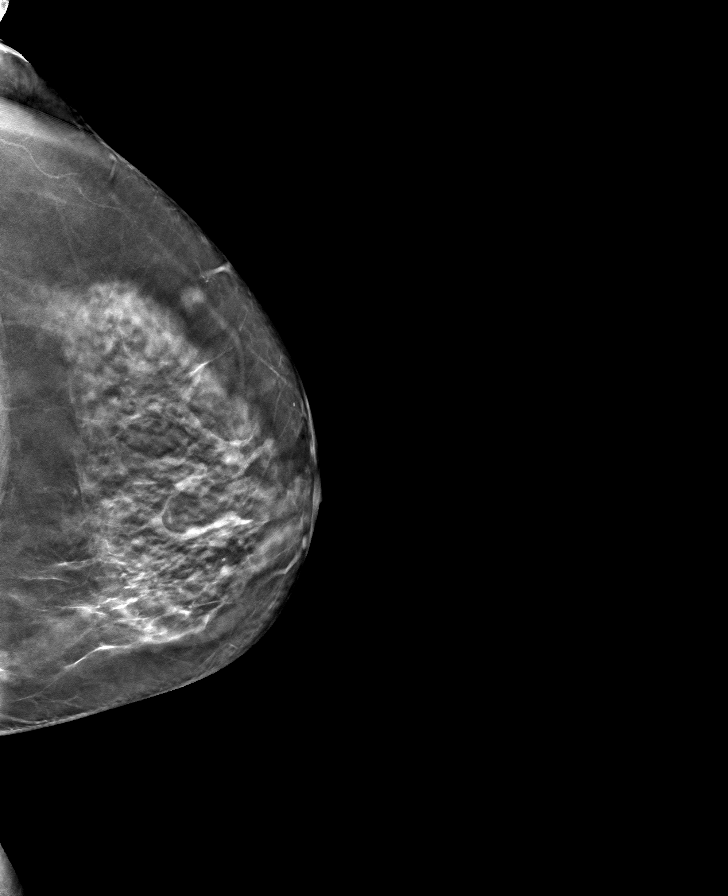

[R MLO tomo · tomo slice 39/78.0]
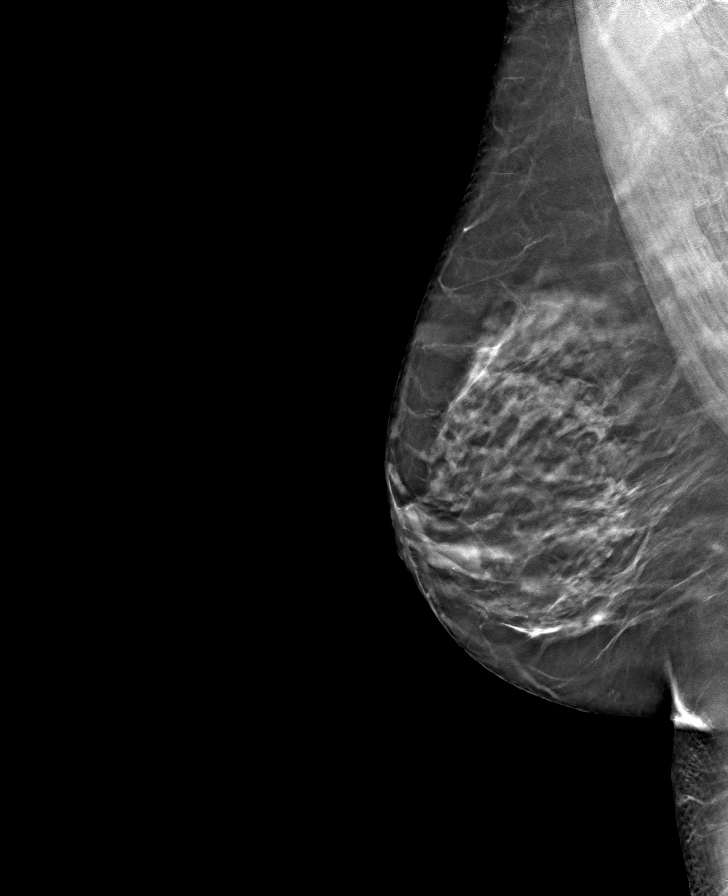

[8 of 24 positions shown; findings below may reference images not displayed]

ACR Breast Density Category c: The breast tissue is heterogeneously
dense, which may obscure small masses.
FINDINGS: There are no findings suspicious for malignancy.
IMPRESSION: No mammographic evidence of malignancy. A result letter of this
screening mammogram will be mailed directly to the patient.

RECOMMENDATION:
Screening mammogram in one year. (Code:[V2])

BI-RADS CATEGORY  1: Negative.

## 2021-11-15 ENCOUNTER — Other Ambulatory Visit: Payer: Self-pay

## 2021-11-15 ENCOUNTER — Encounter (HOSPITAL_COMMUNITY): Payer: Self-pay

## 2021-11-15 ENCOUNTER — Emergency Department (HOSPITAL_COMMUNITY): Payer: Medicare Other

## 2021-11-15 ENCOUNTER — Emergency Department (HOSPITAL_COMMUNITY)
Admission: EM | Admit: 2021-11-15 | Discharge: 2021-11-16 | Disposition: A | Discharge status: Home / Self Care | Payer: Medicare Other | Attending: Emergency Medicine | Admitting: Emergency Medicine

## 2021-11-15 DIAGNOSIS — R079 Chest pain, unspecified: Secondary | ICD-10-CM

## 2021-11-15 DIAGNOSIS — N3000 Acute cystitis without hematuria: Secondary | ICD-10-CM | POA: Diagnosis not present

## 2021-11-15 DIAGNOSIS — N179 Acute kidney failure, unspecified: Secondary | ICD-10-CM | POA: Diagnosis not present

## 2021-11-15 DIAGNOSIS — Z7982 Long term (current) use of aspirin: Secondary | ICD-10-CM | POA: Insufficient documentation

## 2021-11-15 DIAGNOSIS — R200 Anesthesia of skin: Secondary | ICD-10-CM | POA: Diagnosis not present

## 2021-11-15 LAB — BASIC METABOLIC PANEL
Anion gap: 11 (ref 5–15)
BUN: 24 mg/dL — ABNORMAL HIGH (ref 8–23)
CO2: 22 mmol/L (ref 22–32)
Calcium: 9.3 mg/dL (ref 8.9–10.3)
Chloride: 100 mmol/L (ref 98–111)
Creatinine, Ser: 1.51 mg/dL — ABNORMAL HIGH (ref 0.44–1.00)
GFR, Estimated: 38 mL/min — ABNORMAL LOW (ref >60)
Glucose, Bld: 105 mg/dL — ABNORMAL HIGH (ref 70–99)
Potassium: 4.8 mmol/L (ref 3.5–5.1)
Sodium: 133 mmol/L — ABNORMAL LOW (ref 135–145)

## 2021-11-15 LAB — TROPONIN I (HIGH SENSITIVITY)
Troponin I (High Sensitivity): 3 ng/L (ref <18)
Troponin I (High Sensitivity): 3 ng/L (ref <18)

## 2021-11-15 LAB — CBC
HCT: 39.5 % (ref 36.0–46.0)
Hemoglobin: 12.9 g/dL (ref 12.0–15.0)
MCH: 30.9 pg (ref 26.0–34.0)
MCHC: 32.7 g/dL (ref 30.0–36.0)
MCV: 94.7 fL (ref 80.0–100.0)
Platelets: 286 10*3/uL (ref 150–400)
RBC: 4.17 MIL/uL (ref 3.87–5.11)
RDW: 12.7 % (ref 11.5–15.5)
WBC: 7.6 10*3/uL (ref 4.0–10.5)
nRBC: 0 % (ref 0.0–0.2)

## 2021-11-15 IMAGING — DX DG CHEST 2V
2 series · 2 of 2 positions shown · non-contrast
Comparison: None.

CLINICAL DATA: Chest pain

EXAM:
CHEST - 2 VIEW

[w chest pa]
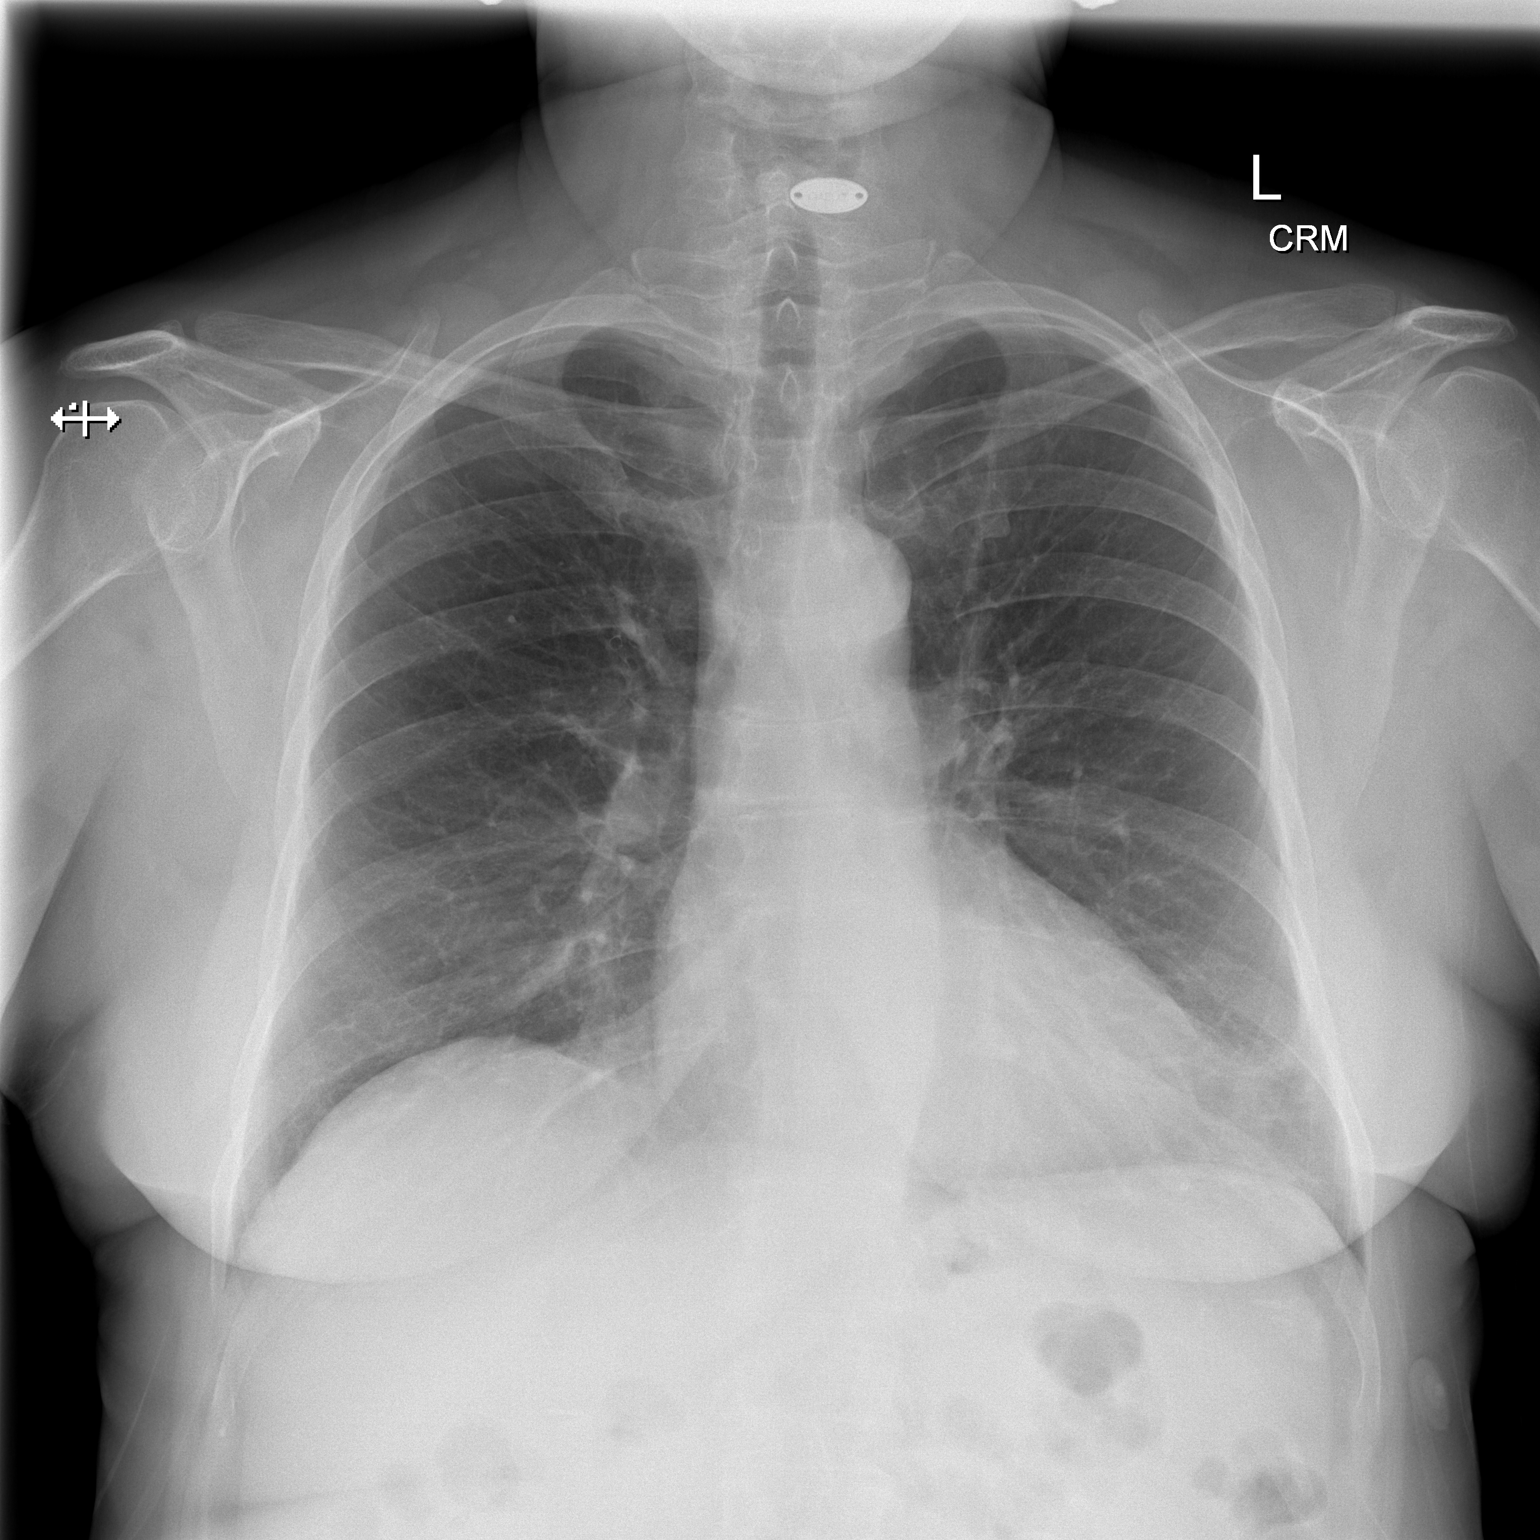

[w chest lat]
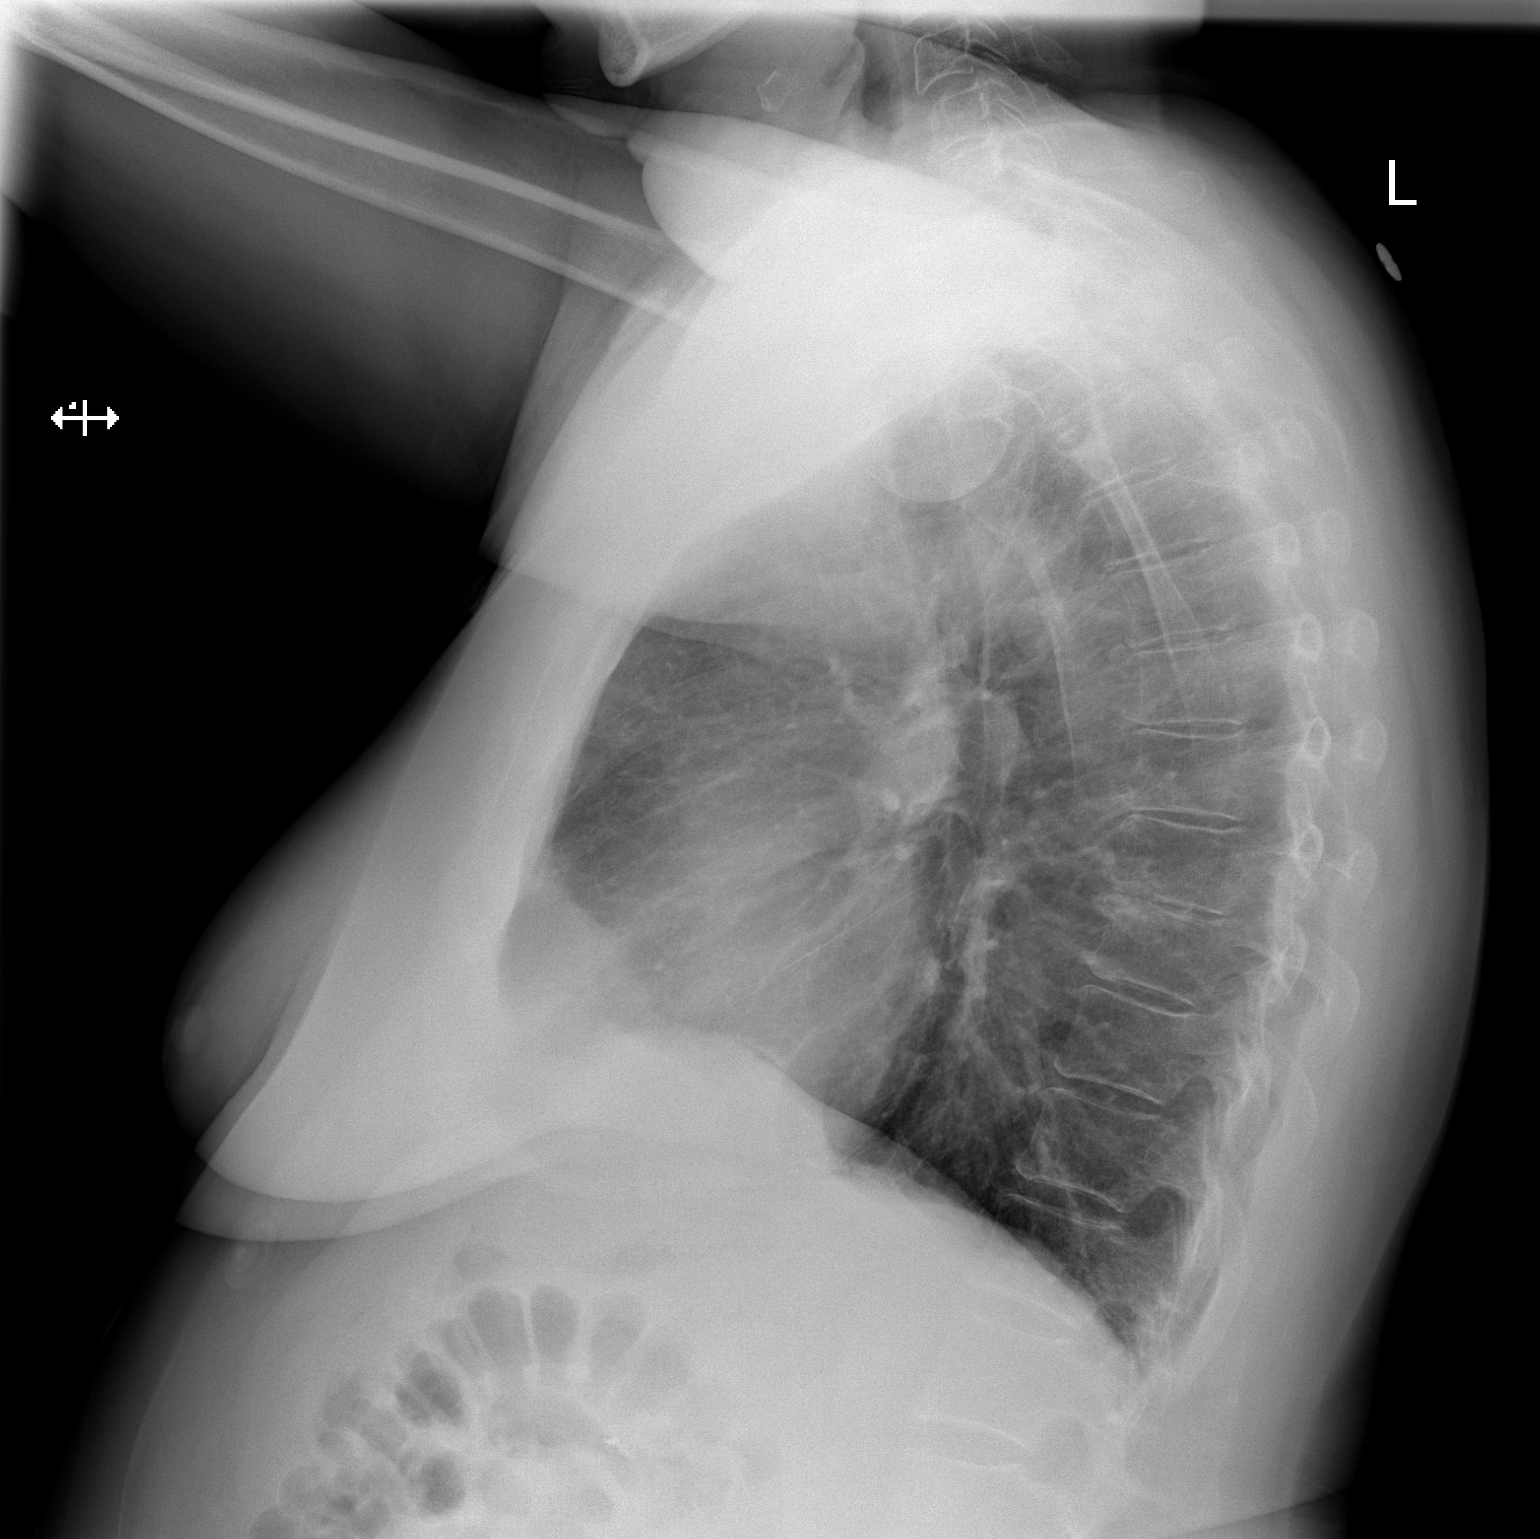

[2 of 2 positions shown; findings below may reference images not displayed]

FINDINGS: The heart size and mediastinal contours are within normal limits.
Both lungs are clear. The visualized skeletal structures are
unremarkable.
IMPRESSION: No active cardiopulmonary disease.

## 2021-11-15 MED ORDER — SODIUM CHLORIDE 0.9 % IV BOLUS
1000.0000 mL | Freq: Once | INTRAVENOUS | Status: AC
  Administered 2021-11-15: 1000 mL via INTRAVENOUS

## 2021-11-15 NOTE — ED Provider Notes (Signed)
Emergency Medicine Provider Triage Evaluation Note  Carolyn Rangel , a 66 y.o. female  was evaluated in triage.  Pt complains of chest pain shoulder pain beginning this morning.  She has an undiagnosed neurologic disorder.  Husband states that today she was very tremulous around 11 AM.  History of high blood pressure, high cholesterol, no history of smoking or diabetes.  He makes her pain worse or better.  Pain is intermittent.  Review of Systems  Positive: CP Negative: SOB  Physical Exam  BP (!) 164/71 (BP Location: Left Arm)    Pulse 65    Temp 98.5 F (36.9 C)    Resp 20    SpO2 97%  Gen:   Awake, no distress   Resp:  Normal effort  MSK:   Moves extremities without difficulty  Other:  NO CHEST PRESSURE  Medical Decision Making  Medically screening exam initiated at 3:01 PM.  Appropriate orders placed.  Carolyn Rangel was informed that the remainder of the evaluation will be completed by another provider, this initial triage assessment does not replace that evaluation, and the importance of remaining in the ED until their evaluation is complete.  WORK UP Hazel Sams, PA-C 15/83/09 [id number]    Campbell Stall P, DO 80/88/11 1640

## 2021-11-15 NOTE — ED Triage Notes (Signed)
Patient complains of chest pain with radiation to left shoulder and scapula x 1 day. Patient alert and oriented. Patient also reports some associated SOB with same.

## 2021-11-15 NOTE — ED Provider Notes (Signed)
[place] EMERGENCY DEPARTMENT Provider Note   CSN: [id number] Arrival date & time: 11/15/21  1338     History  Chief Complaint  Patient presents with   Chest Pain   Weakness    Carolyn Rangel is a 66 y.o. female who presents to the ED today with complaint of gradual onset, intermittent, substernal chest pain with radiation through into back/left scapula that began earlier yesterday.  Patient reports that she has had multiple episodes while in the waiting room.  She states that the last couple of minutes and then dissipate on their own.  She also complains of shortness of breath and nausea.  She does admit that she has had similar chest pain in the past for several months or has not been evaluated for same.  The chest pain was worse today prompting ED visit.  Husband is at bedside and reports that she is currently being worked up for an undiagnosed neurological disorder.  She reports that she has had numbness to the face into the neck for several months.  She is in the process of being followed by [facility] neurology for same and recently saw them on 12/14 for initial consult.  He states that she was told in the past in [place] where they recently moved from that it could all be related to stress and she is unsure if she is having chest pain related to stress as well.   Patient denies any history of CAD.  She does mention several years ago she was worked up for chest pain and had a clean cardiac cath.  She states that it ended up being her gallbladder after she had it removed she had no other issues.  She is a non-smoker.  She denies any history of DVT or PE.  No recent prolonged travel or immobilization.  No hemoptysis.  No active malignancy.  No exogenous hormone use.  The history is provided by the patient, medical records and the spouse.      Home Medications Prior to Admission medications   Medication Sig Start Date End Date Taking? Authorizing Provider   acetaminophen (TYLENOL) 500 MG tablet Take 500 mg by mouth every 6 (six) hours as needed for mild pain.   Yes [provider]  aspirin 81 MG EC tablet Take by mouth.   Yes [provider]  cephALEXin (KEFLEX) 500 MG capsule Take 1 capsule (500 mg total) by mouth 2 (two) times daily for 5 days. 11/16/21 11/21/21 Yes Clovia Reine, PA-C  Cholecalciferol 25 MCG (1000 UT) tablet Take 1,000 Units by mouth daily.   Yes [provider]  clonazePAM (KLONOPIN) 0.5 MG tablet Take 0.5 mg by mouth daily.   Yes [provider]  Coenzyme Q10 (COQ10 PO) Take 1 tablet by mouth daily.   Yes [provider]  diclofenac (VOLTAREN) 75 MG EC tablet Take 75 mg by mouth 2 (two) times daily as needed for mild pain. 11/01/21  Yes [provider]  hydrochlorothiazide (MICROZIDE) 12.5 MG capsule Take 12.5 mg by mouth every morning. 11/03/21  Yes [provider]  ibuprofen (ADVIL) 200 MG tablet Take 200 mg by mouth every 6 (six) hours as needed for mild pain.   Yes [provider]  losartan (COZAAR) 100 MG tablet Take 100 mg by mouth daily. 11/10/21  Yes [provider]  metoprolol succinate (TOPROL-XL) 50 MG 24 hr tablet Take by mouth. 03/26/17  Yes [provider]  pantoprazole (PROTONIX) 40 MG tablet Take  40 mg by mouth daily. 09/23/21  Yes [provider]  zolpidem (AMBIEN CR) 12.5 MG CR tablet Take 12.5 mg by mouth at bedtime as needed for sleep. 10/20/21  Yes [provider]      Allergies    Sulfa antibiotics, Penicillins, Simvastatin, Fluoxetine, Pravastatin, and Sulfamethoxazole-trimethoprim    Review of Systems   Review of Systems  Constitutional:  Negative for chills and fever.  Respiratory:  Positive for shortness of breath. Negative for cough.   Cardiovascular:  Positive for chest pain.  Gastrointestinal:  Positive for nausea. Negative for vomiting.  All other systems reviewed and are  negative.  Physical Exam Updated Vital Signs BP 134/83 (BP Location: Left Arm)    Pulse 66    Temp 99.2 F (37.3 C) (Oral)    Resp 18    SpO2 98%  Physical Exam Vitals and nursing note reviewed.  Constitutional:      Appearance: She is not ill-appearing or diaphoretic.  HENT:     Head: Normocephalic and atraumatic.  Eyes:     Conjunctiva/sclera: Conjunctivae normal.  Cardiovascular:     Rate and Rhythm: Normal rate and regular rhythm.     Pulses: Normal pulses.     Comments: Equal radial and PT pulses bilaterally Pulmonary:     Effort: Pulmonary effort is normal.     Breath sounds: Normal breath sounds. No wheezing, rhonchi or rales.  Chest:     Chest wall: No tenderness.  Abdominal:     Palpations: Abdomen is soft.     Tenderness: There is no abdominal tenderness. There is no guarding or rebound.  Musculoskeletal:     Cervical back: Neck supple.     Right lower leg: No edema.     Left lower leg: No edema.  Skin:    General: Skin is warm and dry.  Neurological:     Mental Status: She is alert.    ED Results / Procedures / Treatments   Labs (all labs ordered are listed, but only abnormal results are displayed) Labs Reviewed  BASIC METABOLIC PANEL - Abnormal; Notable for the following components:      Result Value   Sodium 133 (*)    Glucose, Bld 105 (*)    BUN 24 (*)    Creatinine, Ser 1.51 (*)    GFR, Estimated 38 (*)    All other components within normal limits  URINALYSIS, ROUTINE W REFLEX MICROSCOPIC - Abnormal; Notable for the following components:   APPearance HAZY (*)    Specific Gravity, Urine <1.005 (*)    Nitrite POSITIVE (*)    Leukocytes,Ua MODERATE (*)    All other components within normal limits  URINALYSIS, MICROSCOPIC (REFLEX) - Abnormal; Notable for the following components:   Bacteria, UA FEW (*)    All other components within normal limits  URINE CULTURE  CBC  D-DIMER, QUANTITATIVE  TROPONIN I (HIGH SENSITIVITY)  TROPONIN I (HIGH  SENSITIVITY)    EKG None  Radiology DG Chest 2 View  Result Date: 11/15/2021 CLINICAL DATA:  Chest pain EXAM: CHEST - 2 VIEW COMPARISON:  None. FINDINGS: The heart size and mediastinal contours are within normal limits. Both lungs are clear. The visualized skeletal structures are unremarkable. IMPRESSION: No active cardiopulmonary disease. Electronically Signed   By: Franchot Gallo M.D.   On: 11/15/2021 15:24    Procedures Procedures    Medications Ordered in ED Medications  sodium chloride 0.9 % bolus 1,000 mL (1,000 mLs Intravenous New Bag/Given 11/15/21 2315)  cefTRIAXone (ROCEPHIN) 1 g in sodium chloride 0.9 % 100 mL IVPB (0 g Intravenous Stopped 11/16/21 0206)    ED Course/ Medical Decision Making/ A&P Clinical Course as of 11/16/21 0206  Wed Nov 16, 2021  0032 D-Dimer, Quant: 0.31 [MV]    Clinical Course User Index [MV] Eustaquio Maize, PA-C                           Medical Decision Making 66 year old female presents to the ED today with complaint of intermittent substernal chest pain with radiation into her back for the past day.  On arrival to the ED vitals were stable.  Patient was medically screened and work-up started for ACS.  EKG with nonspecific T wave changes; not previous to compare to.  Troponins have been flat x2. CXR clear.  BMP with a sodium of 133, creatinine of 1.51 and a BUN of 24.  Per care everywhere baseline creatinine last year was 0.70.  Patient denies any GI symptoms distress volume loss.  We will plan for fluid rehydration at this time.  Question of this could be causing her generalized fatigue today.  Given age I am unable to [facility] patient out for PE.  We will add on D-dimer at this time.  If positive we will plan for CTA.  If negative we will plan to discharge patient home with cardiology follow-up.  D dimer 0.31. Do not feel pt requires CTA at this time as she is low Well's risk for PE.  U/A positive for nitrites, leuks, and > 50 WBCs per H[facility]. Urine  culture sent. Question if this is causing some of pt's generalized weakness today. Will treat with dose of rocephin in the ED and discharge home with keflex.   Chest pain workup reassuring today. Will have pt follow up in the outpatient setting for further eval. She is also instructed to follow up with her PCP for recheck of kidney function and urinalysis in about 1-2 weeks time. She is in agreement with plan and stable for discharge home.   This note was prepared using Dragon voice recognition software and may include unintentional dictation errors due to the inherent limitations of voice recognition software.   Problems Addressed: Acute cystitis without hematuria: acute illness or injury AKI (acute kidney injury) [facility]): acute illness or injury Nonspecific chest pain: acute illness or injury  Amount and/or Complexity of Data Reviewed External Data Reviewed: labs.    Details: Creatinine 0.70 12/07/20 Labs: ordered. Decision-making details documented in ED Course. Radiology: ordered. Decision-making details documented in ED Course. ECG/medicine tests: ordered. Decision-making details documented in ED Course.  Risk Prescription drug management.           Final Clinical Impression(s) / ED Diagnoses Final diagnoses:  AKI (acute kidney injury) ([place])  Nonspecific chest pain  Acute cystitis without hematuria    Rx / DC Orders ED Discharge Orders          Ordered    cephALEXin (KEFLEX) 500 MG capsule  2 times daily        11/16/21 0204             Discharge Instructions      Please pick up antibiotics and take as prescribed for your urinary tract infection. We have sent your urine for culture and will call you in 2-3 days time IF the antibiotic needs to be changed.   Continue pushing fluids given slightly elevated kidney function today. You will  need to have your kidney function and urine rechecked in about 1- 2 weeks by your PCP.   Please also follow up with  cardiology for further evaluation of your chest pains. You can follow up with either your husband's cardiology or call the number above to see any one of their providers.   Return to the ED for any new/worsening symptoms       Eustaquio Maize, PA-C 11/16/21 0206    Fatima Blank, MD 11/18/21 [phone]

## 2021-11-16 DIAGNOSIS — N179 Acute kidney failure, unspecified: Secondary | ICD-10-CM | POA: Diagnosis not present

## 2021-11-16 LAB — URINALYSIS, ROUTINE W REFLEX MICROSCOPIC
Bilirubin Urine: NEGATIVE
Glucose, UA: NEGATIVE mg/dL
Hgb urine dipstick: NEGATIVE
Ketones, ur: NEGATIVE mg/dL
Nitrite: POSITIVE — AB
Protein, ur: NEGATIVE mg/dL
Specific Gravity, Urine: <1.005 — ABNORMAL LOW (ref 1.005–1.030)
pH: 6 (ref 5.0–8.0)

## 2021-11-16 LAB — D-DIMER, QUANTITATIVE: D-Dimer, Quant: 0.31 ug/mL-FEU (ref 0.00–0.50)

## 2021-11-16 LAB — URINALYSIS, MICROSCOPIC (REFLEX): WBC, UA: >50 WBC/hpf (ref 0–5)

## 2021-11-16 MED ORDER — SODIUM CHLORIDE 0.9 % IV SOLN
INTRAVENOUS | Status: AC
  Filled 2021-11-16: qty 10

## 2021-11-16 MED ORDER — CEPHALEXIN 500 MG PO CAPS: 500.0000 mg | ORAL_CAPSULE | Freq: Two times a day (BID) | ORAL | 0 refills | Status: AC

## 2021-11-16 MED ORDER — CEFTRIAXONE SODIUM 1 G IJ SOLR
1.0000 g | Freq: Once | INTRAMUSCULAR | Status: AC
  Administered 2021-11-16: 1 g via INTRAVENOUS
  Filled 2021-11-16: qty 10

## 2021-11-16 NOTE — Discharge Instructions (Signed)
Please pick up antibiotics and take as prescribed for your urinary tract infection. We have sent your urine for culture and will call you in 2-3 days time IF the antibiotic needs to be changed.   Continue pushing fluids given slightly elevated kidney function today. You will need to have your kidney function and urine rechecked in about 1- 2 weeks by your PCP.   Please also follow up with cardiology for further evaluation of your chest pains. You can follow up with either your husband's cardiology or call the number above to see any one of their providers.   Return to the ED for any new/worsening symptoms

## 2021-11-17 ENCOUNTER — Telehealth: Payer: Self-pay

## 2021-11-17 NOTE — Telephone Encounter (Signed)
Notes scanned to referral 

## 2021-11-18 LAB — URINE CULTURE: Culture: >=100000 — AB

## 2021-11-19 ENCOUNTER — Telehealth: Payer: Self-pay | Admitting: Emergency Medicine

## 2021-11-19 NOTE — Telephone Encounter (Signed)
Post ED Visit - Positive Culture Follow-up: Successful Patient Follow-Up  Culture assessed and recommendations reviewed by:  []  Elenor Quinones, Pharm.D. []  Heide Guile, Pharm.D., BCPS AQ-ID []  Parks Neptune, Pharm.D., BCPS []  Alycia Rossetti, Pharm.D., BCPS []  [place], Pharm.D., BCPS, AAHIVP []  Legrand Como, Pharm.D., BCPS, AAHIVP []  Salome Arnt, PharmD, BCPS []  Johnnette Gourd, PharmD, BCPS []  Hughes Better, PharmD, BCPS [x]  Bertis Ruddy, PharmD  Positive urine culture  []  Patient discharged without antimicrobial prescription and treatment is now indicated [x]  Organism is resistant to prescribed ED discharge antimicrobial []  Patient with positive blood cultures  Changes discussed with ED provider: Octaviano Glow MD New antibiotic prescription Omnicef 300mg  BID for five days Called to [facility] [employer]) [phone]  Contacted patient, date 11/19/2021, time [place] 11/19/2021, 4:44 PM

## 2021-11-19 NOTE — Progress Notes (Signed)
ED Antimicrobial Stewardship Positive Culture Follow Up   Carolyn Rangel is an 66 y.o. female who presented to [facility] on 11/15/2021 with a chief complaint of  Chief Complaint  Patient presents with   Chest Pain   Weakness    Recent Results (from the past 720 hour(s))  Urine Culture     Status: Abnormal   Collection Time: 11/16/21 12:58 AM   Specimen: Urine, Clean Catch  Result Value Ref Range Status   Specimen Description URINE, CLEAN CATCH  Final   Special Requests   Final    ADDED [phone] 11/16/2021 Performed at [facility], [place] [address], [place], [place] [address]    Culture >=100,000 COLONIES/mL ESCHERICHIA COLI (A)  Final   Report Status 11/18/2021 FINAL  Final   Organism ID, Bacteria ESCHERICHIA COLI (A)  Final      Susceptibility   Escherichia coli - MIC*    AMPICILLIN >=32 RESISTANT Resistant     CEFAZOLIN >=64 RESISTANT Resistant     CEFEPIME <=0.12 SENSITIVE Sensitive     CEFTRIAXONE 0.5 SENSITIVE Sensitive     CIPROFLOXACIN <=0.25 SENSITIVE Sensitive     GENTAMICIN <=1 SENSITIVE Sensitive     IMIPENEM <=0.25 SENSITIVE Sensitive     NITROFURANTOIN <=16 SENSITIVE Sensitive     TRIMETH/SULFA <=20 SENSITIVE Sensitive     AMPICILLIN/SULBACTAM >=32 RESISTANT Resistant     PIP/TAZO 8 SENSITIVE Sensitive     * >=100,000 COLONIES/mL ESCHERICHIA COLI    [x]  Treated with keflex, organism resistant to prescribed antimicrobial   New antibiotic prescription: [place]  ED Provider: Octaviano Glow, MD   Carolyn Rangel 11/19/2021, 11:03 AM Clinical Pharmacist Monday - Friday phone -  [phone] Saturday - Sunday phone - [phone]

## 2021-11-21 ENCOUNTER — Other Ambulatory Visit: Payer: Self-pay

## 2021-11-21 ENCOUNTER — Ambulatory Visit (INDEPENDENT_AMBULATORY_CARE_PROVIDER_SITE_OTHER): Payer: Medicare Other | Admitting: Internal Medicine

## 2021-11-21 VITALS — BP 128/72 | HR 57 | Ht 66.0 in | Wt 177.0 lb

## 2021-11-21 DIAGNOSIS — R0789 Other chest pain: Secondary | ICD-10-CM | POA: Diagnosis not present

## 2021-11-21 NOTE — Progress Notes (Signed)
Cardiology Office Note:    Date:  11/21/2021   ID:  [name], DOB March 07, 1956, MRN [id number]  PCP:  Velna Hatchet, MD   [facility] Providers Cardiologist:  Janina Mayo, MD     Referring MD: Velna Hatchet, MD   No chief complaint on file. Chest pain  History of Present Illness:    Carolyn Rangel is a 66 y.o. female with a hx of htn well controlled, referral from ED for chest pain  On the day she went to the ED, she was feeling weak.  She felt chills. She noted some chest pain and pain between the shoulders. She was HDS. She had no ischemic changes on her EKG. Her troponin was negative.She had a similar problem in the past. She was diagnosed with e coli UTI.  She was followed in [place] , [place] and having similar symptoms. She had cholecystitis. This was in 2010. She had a LHC and it was normal during this time for the work up.   Today she reports that she had some chest discomfort last night and this AM.  It was 8/10. It lasted 20 minutes. She says she was sitting watching . Its midsternal, below both breasts and back pain. She's had these "spells" for a year and a half. She's being worked up with neurology. She's had some numbness and tingling. She gets numbness from her teeth up to the head and it spreads to her chest. She has a nerve study planned at [facility]. [facility] [employer] and does errands for her physical. She has no chest pain with activity.  She has no DOE.   She has no hx of smoking.  Her father may have CAD. He passed when he was 60. Mother died at 35, she had metastatic breast cancer. She has sister and brother. No CAD. They have hypertension.   She has normal blood pressure. No hx stroke. No history of diabetes.  Current Medications: No outpatient medications have been marked as taking for the 11/21/21 encounter (Office Visit) with Janina Mayo, MD.     Allergies:   Sulfa antibiotics, Penicillins, Simvastatin, Fluoxetine, Pravastatin, and  Sulfamethoxazole-trimethoprim   Social History   Socioeconomic History   Marital status: Married    Spouse name: Not on file   Number of children: Not on file   Years of education: Not on file   Highest education level: Not on file  Occupational History   Not on file  Tobacco Use   Smoking status: Not on file   Smokeless tobacco: Not on file  Substance and Sexual Activity   Alcohol use: Not on file   Drug use: Not on file   Sexual activity: Not on file  Other Topics Concern   Not on file  Social History Narrative   Not on file   Social Determinants of Health   Financial Resource Strain: Not on file  Food Insecurity: Not on file  Transportation Needs: Not on file  Physical Activity: Not on file  Stress: Not on file  Social Connections: Not on file     Family History: The patient's per above family history is not on file.  ROS:   Please see the history of present illness.     All other systems reviewed and are negative.  EKGs/Labs/Other Studies Reviewed:    The following studies were reviewed today:   EKG:  EKG is  ordered today.  The ekg ordered today demonstrates   Sinus bradycardia HR  57 bpm, no ischemic changes, no scar  Recent Labs: 11/15/2021: BUN 24; Creatinine, Ser 1.51; Hemoglobin 12.9; Platelets 286; Potassium 4.8; Sodium 133  Recent Lipid Panel No results found for: CHOL, TRIG, HDL, CHOLHDL, VLDL, LDLCALC, LDLDIRECT   Risk Assessment/Calculations:           Physical Exam:    VS:  BP 128/72    Pulse (!) 57    Ht 5\' 6"  (1.676 m)    Wt 177 lb (80.3 kg)    SpO2 97%    BMI 28.57 kg/m     Wt Readings from Last 3 Encounters:  11/21/21 177 lb (80.3 kg)     GEN:  Well nourished, well developed in no acute distress HEENT: Normal NECK: No JVD; No carotid bruits LYMPHATICS: No lymphadenopathy CARDIAC: RRR, no murmurs, rubs, gallops RESPIRATORY:  Clear to auscultation without rales, wheezing or rhonchi  ABDOMEN: Soft, non-tender,  non-distended MUSCULOSKELETAL:  No edema; No deformity  SKIN: Warm and dry NEUROLOGIC:  Alert and oriented x 3 PSYCHIATRIC:  Normal affect   ASSESSMENT:    #Atypical CP: Her chest pain symptoms don't correlate with activity and are chronic.  She has no DOE. She has no cardiac hx. She has no premature family hx of CAD. We discussed symptoms of CAD and recommended that she return to the ED if she has progressive symptoms.  PLAN:    In order of problems listed above:  Follow up PRN      Medication Adjustments/Labs and Tests Ordered: Current medicines are reviewed at length with the patient today.  Concerns regarding medicines are outlined above.  No orders of the defined types were placed in this encounter.  No orders of the defined types were placed in this encounter.   Patient Instructions  Medication Instructions:  No Changes In Medications at this time.  *If you need a refill on your cardiac medications before your next appointment, please call your pharmacy*  Follow-Up: At [facility], you and your health needs are our priority.  As part of our continuing mission to provide you with exceptional heart care, we have created designated Provider Care Teams.  These Care Teams include your primary Cardiologist (physician) and Advanced Practice Providers (APPs -  Physician Assistants and Nurse Practitioners) who all work together to provide you with the care you need, when you need it.  Your next appointment:   AS NEEDED   The format for your next appointment:   In Person  Provider:   Janina Mayo, MD     Signed, Janina Mayo, MD  11/21/2021 11:34 AM    [place]

## 2021-11-21 NOTE — Patient Instructions (Signed)

## 2022-03-14 ENCOUNTER — Other Ambulatory Visit: Payer: Self-pay | Admitting: Orthopedic Surgery

## 2022-03-15 ENCOUNTER — Other Ambulatory Visit: Payer: Self-pay | Admitting: Orthopedic Surgery

## 2022-03-15 DIAGNOSIS — M25562 Pain in left knee: Secondary | ICD-10-CM

## 2022-03-22 ENCOUNTER — Ambulatory Visit
Admission: RE | Admit: 2022-03-22 | Discharge: 2022-03-22 | Disposition: A | Discharge status: Home / Self Care | Payer: Medicare Other | Source: Ambulatory Visit | Attending: Orthopedic Surgery | Admitting: Orthopedic Surgery

## 2022-03-22 DIAGNOSIS — M25562 Pain in left knee: Secondary | ICD-10-CM

## 2022-03-22 IMAGING — MR MR KNEE*L* W/O CM
6 series · 40 of 40 positions shown · non-contrast
Comparison: None Available.

CLINICAL DATA: Left medial knee pain since falling [DATE]. No
previous relevant surgery.

EXAM:
MRI OF THE LEFT KNEE WITHOUT CONTRAST
TECHNIQUE: Multiplanar, multisequence MR imaging of the knee was performed. No
intravenous contrast was administered.

[Series 6: T2 fat-sat · axial · left · 4.0mm · 0.50mm/px · z∈[-64,+90]mm · 9 of 36 slices shown (1 of 3)]
[im 1/36]
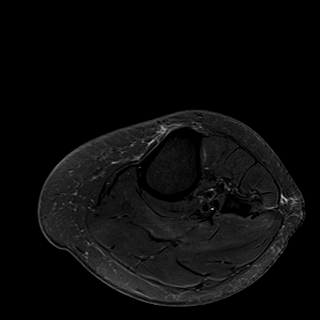
[im 5/36]
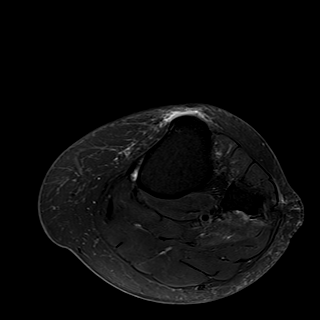
[im 9/36]
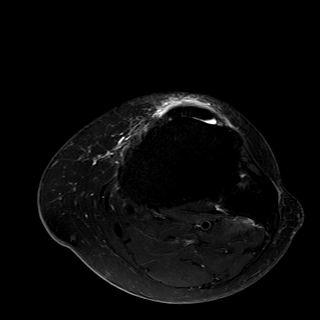
[im 14/36]
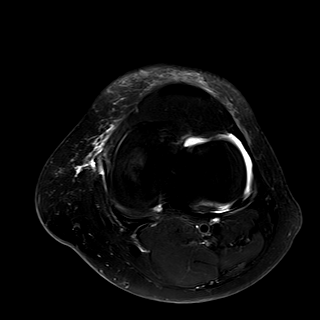
[im 18/36]
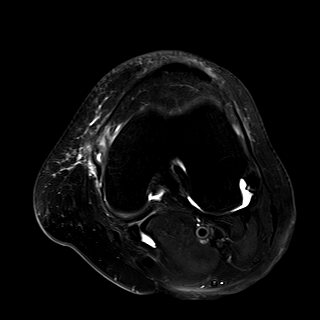
[im 22/36]
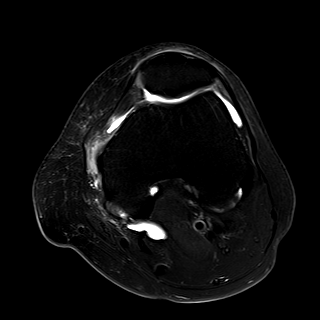
[im 27/36]
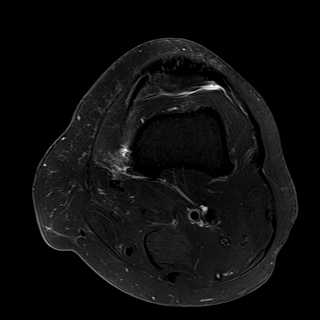
[im 31/36]
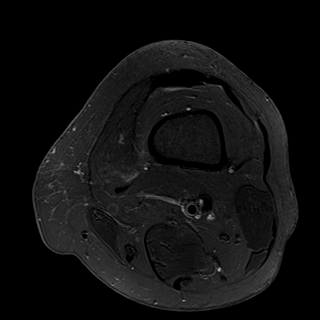
[im 36/36]
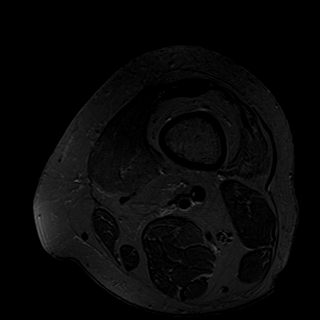

[Series 7: T2 fat-sat · coronal · left · 4.0mm · 0.47mm/px · 6 of 28 slices shown (2 of 3)]
[im 1/28]
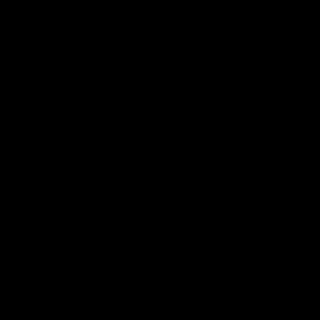
[im 6/28]
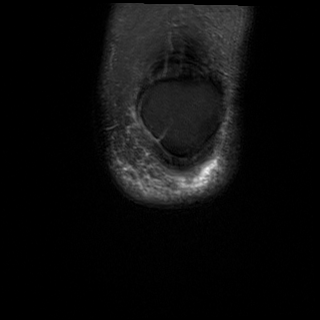
[im 11/28]
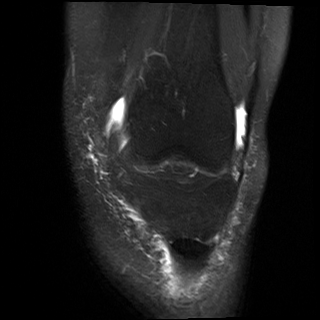
[im 17/28]
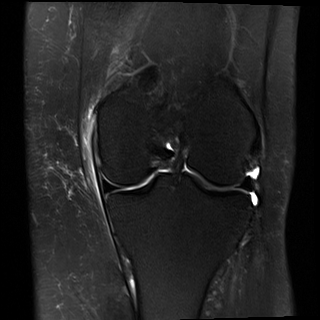
[im 22/28]
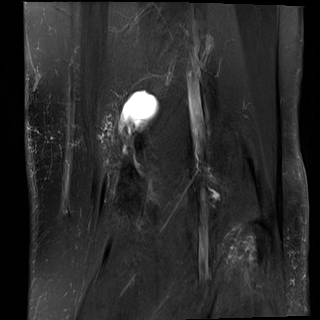
[im 28/28]
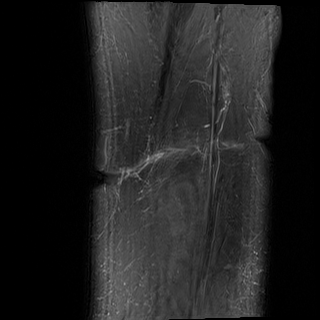

[Series 8: T1 · coronal · left · 4.0mm · 0.47mm/px · 6 of 28 slices shown]
[im 1/28]
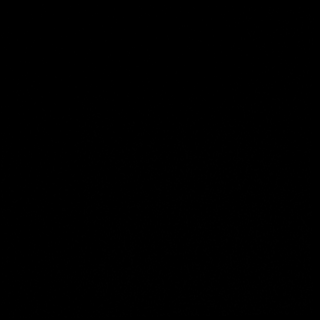
[im 6/28]
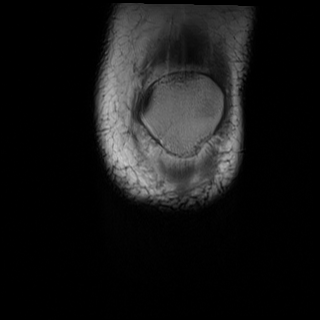
[im 11/28]
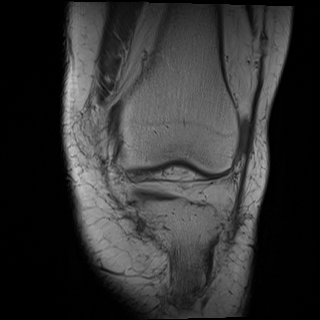
[im 17/28]
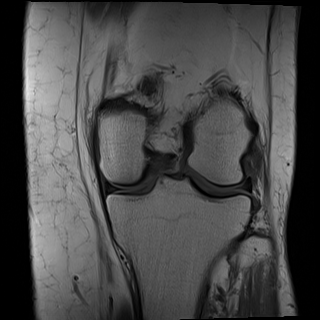
[im 22/28]
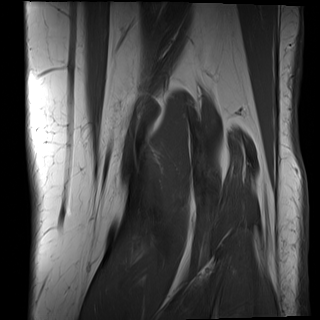
[im 28/28]
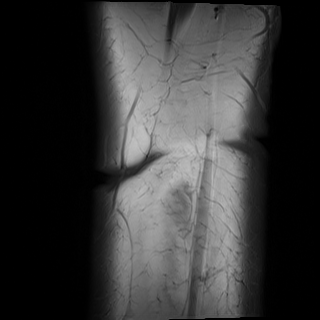

[Series 9: PD fat-sat · coronal · left · 3.0mm · 0.47mm/px · 7 of 30 slices shown (1 of 2)]
[im 1/30]
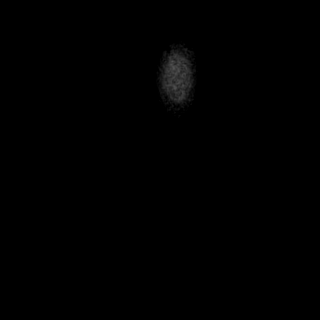
[im 5/30]
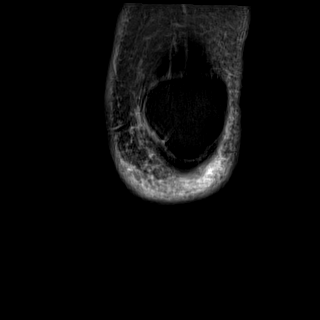
[im 10/30]
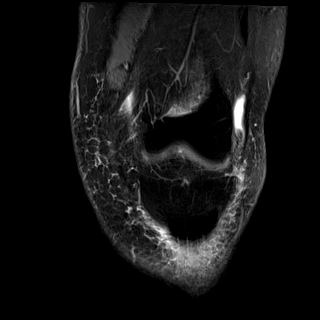
[im 15/30]
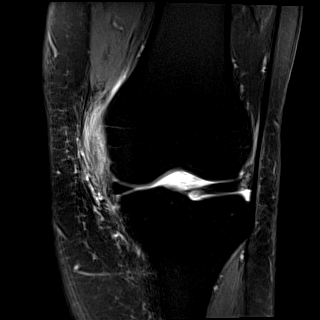
[im 20/30]
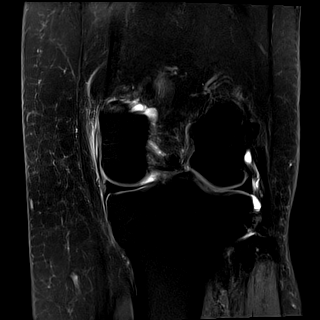
[im 25/30]
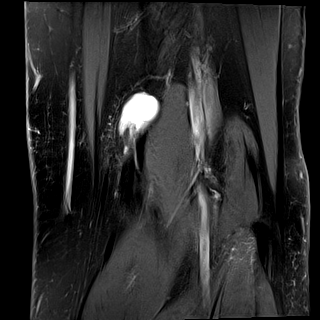
[im 30/30]
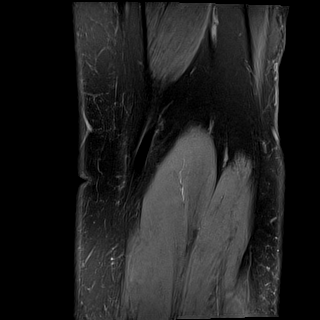

[Series 10: PD fat-sat · sagittal · left · 3.0mm · 0.39mm/px · 6 of 27 slices shown (2 of 2)]
[im 1/27]
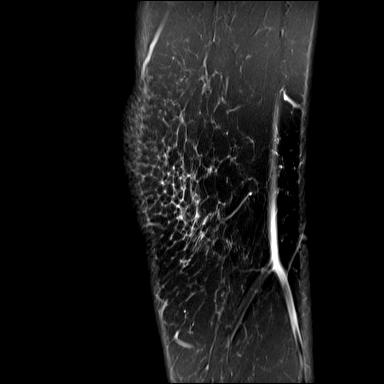
[im 6/27]
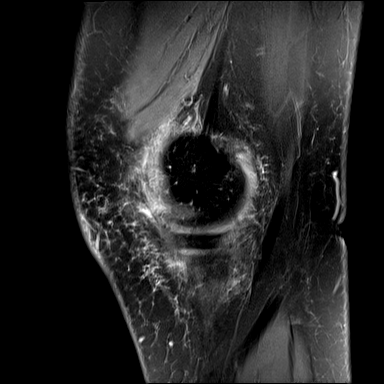
[im 11/27]
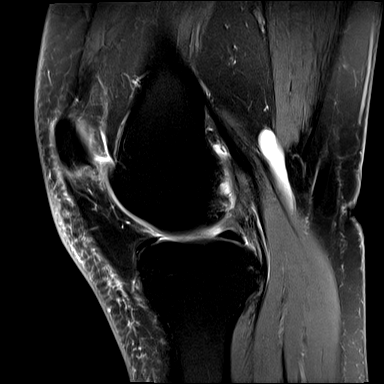
[im 16/27]
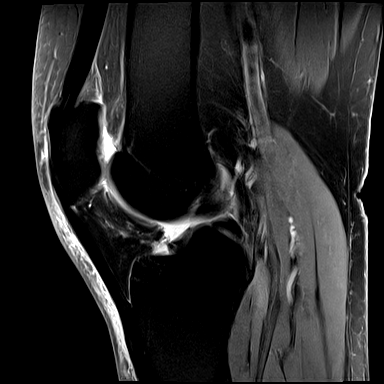
[im 21/27]
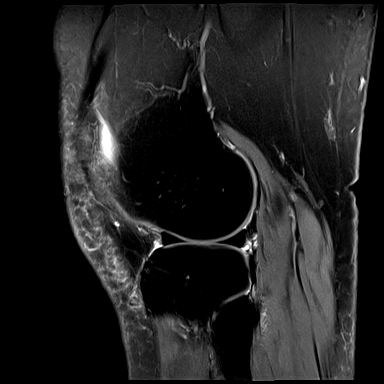
[im 27/27]
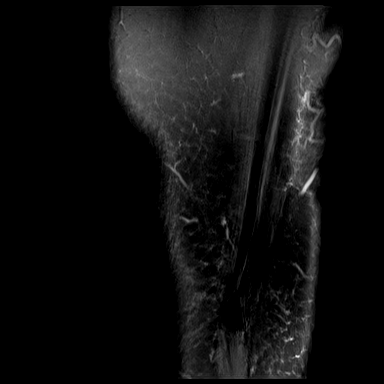

[Series 11: T2 fat-sat · sagittal · left · 3.0mm · 0.39mm/px · 6 of 27 slices shown (3 of 3)]
[im 1/27]
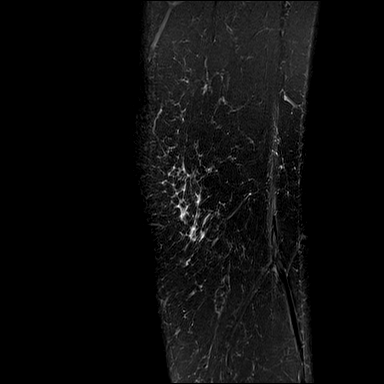
[im 6/27]
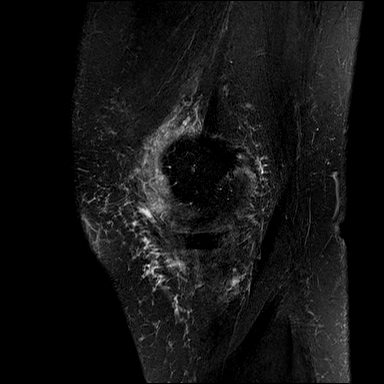
[im 11/27]
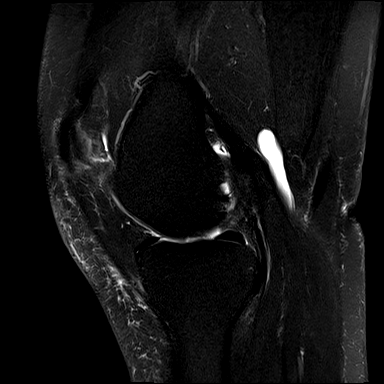
[im 16/27]
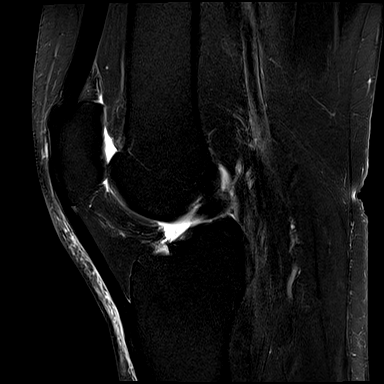
[im 21/27]
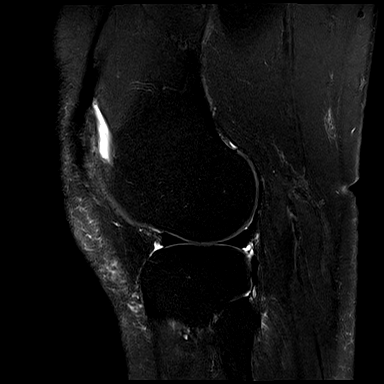
[im 27/27]
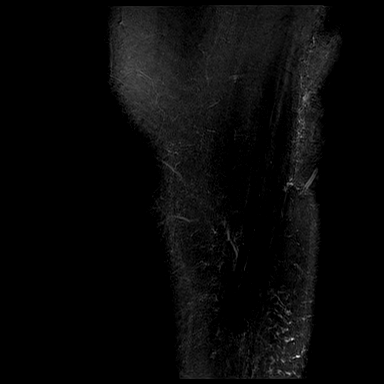

[40 of 40 positions shown; findings below may reference images not displayed]

FINDINGS: MENISCI

Medial meniscus:  Intact with normal morphology.

Lateral meniscus:  Intact with normal morphology.

LIGAMENTS

Cruciates:  Intact.

Collaterals: There is a partial tear of the medial collateral
ligament proximally. The ligament is thickened with surrounding soft
tissue swelling and ill-defined fluid. No ligamentous retraction
identified. The fibular collateral ligament and iliotibial band
appear normal.

CARTILAGE

Patellofemoral: Mild patellar chondral thinning and surface
irregularity. No significant subchondral signal abnormality.

Medial:  Preserved.

Lateral:  Preserved.

MISCELLANEOUS

Joint:  Small joint effusion.

Popliteal Fossa: The popliteus muscle and tendon are intact. Small
Baker's cyst.

Extensor Mechanism:  Intact.

Bones:  No acute or significant extra-articular osseous findings.

Other: In addition to the soft tissue swelling superficial to the
medial collateral ligament, there is mild prepatellar subcutaneous
edema. No other focal fluid collections.
IMPRESSION: 1. Grade [DATE] tear of the medial collateral ligament.
2. No associated osseous injury or meniscal tear. The cruciate
ligaments are intact.
3. Small joint effusion and small Baker's cyst.
4. Mild patellar chondromalacia.

## 2022-04-06 ENCOUNTER — Other Ambulatory Visit: Payer: Self-pay | Admitting: Orthopedic Surgery

## 2022-04-06 DIAGNOSIS — M25551 Pain in right hip: Secondary | ICD-10-CM

## 2022-04-23 ENCOUNTER — Ambulatory Visit
Admission: RE | Admit: 2022-04-23 | Discharge: 2022-04-23 | Disposition: A | Discharge status: Home / Self Care | Payer: Medicare Other | Source: Ambulatory Visit | Attending: Orthopedic Surgery | Admitting: Orthopedic Surgery

## 2022-04-23 DIAGNOSIS — M25551 Pain in right hip: Secondary | ICD-10-CM

## 2022-04-23 IMAGING — MR MR HIP*R* W/O CM
5 series · 33 of 40 positions shown · non-contrast
Comparison: None Available.

CLINICAL DATA: Right hip pain

EXAM:
MR OF THE RIGHT HIP WITHOUT CONTRAST
TECHNIQUE: Multiplanar, multisequence MR imaging was performed. No intravenous
contrast was administered.

[Series 3: T1 · coronal · 5.0mm · 0.74mm/px · 2 of 22 slices shown]
[im 1/22]
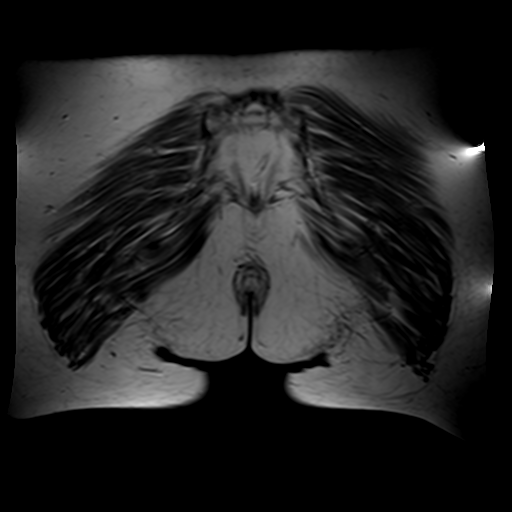
[im 5/22]
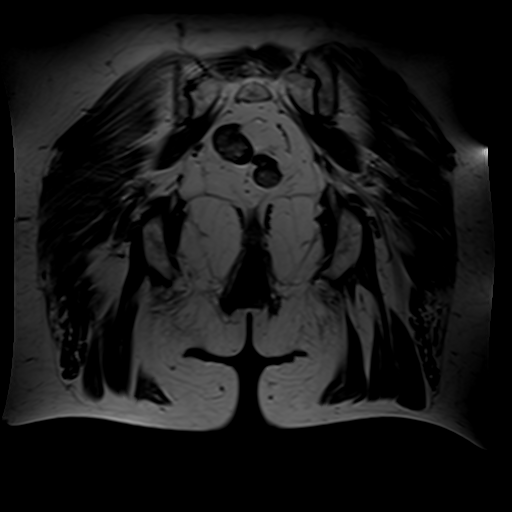

[Series 4: T2 fat-sat · coronal · 4.0mm · 0.76mm/px · 6 of 22 slices shown (1 of 2)]
[im 1/22]
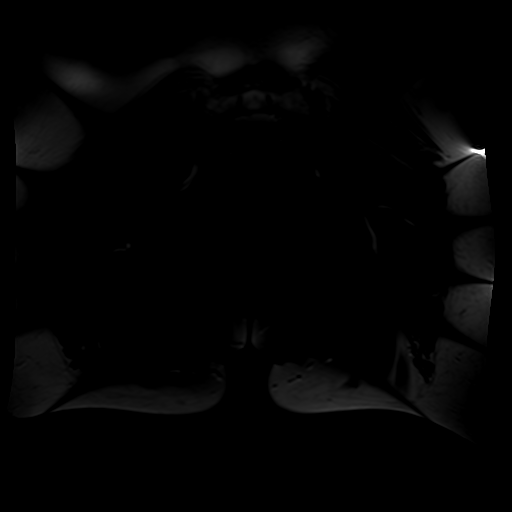
[im 5/22]
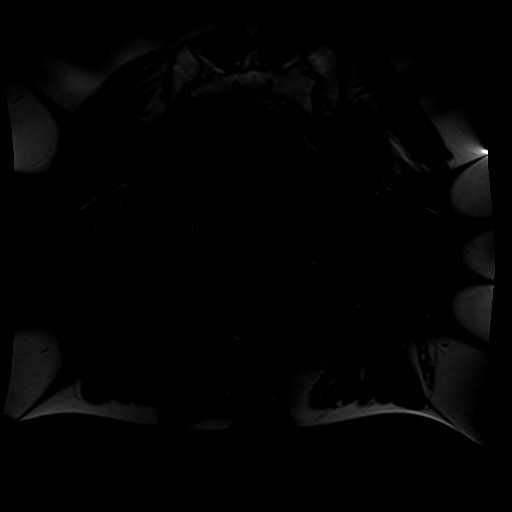
[im 9/22]
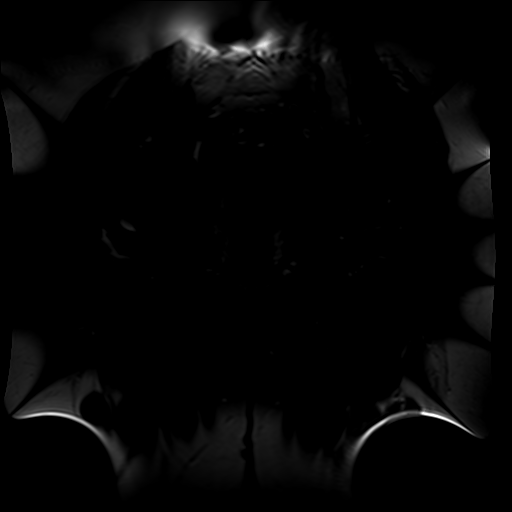
[im 13/22]
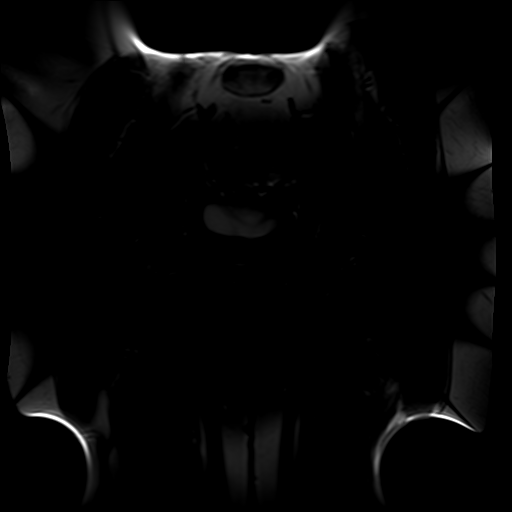
[im 17/22]
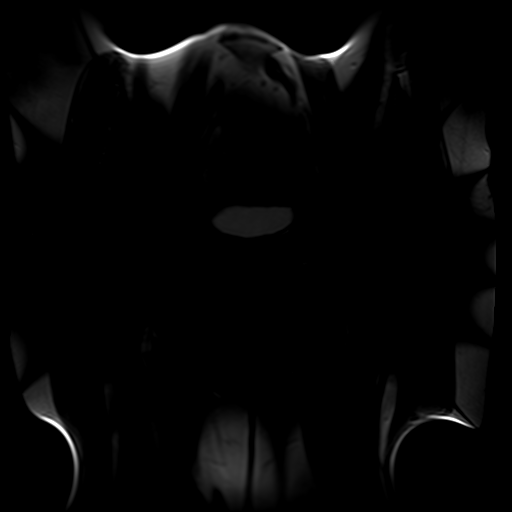
[im 22/22]
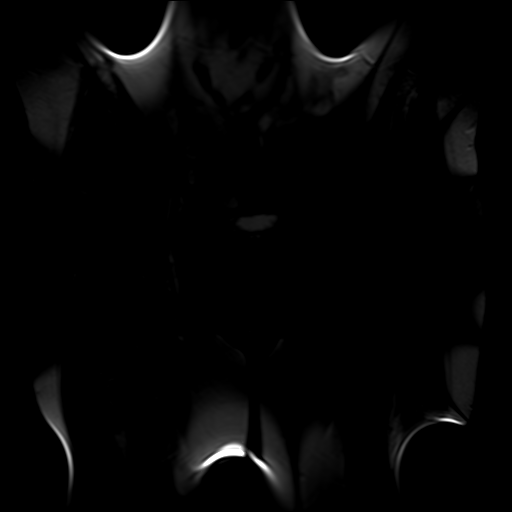

[Series 5: T2 fat-sat · axial · 4.0mm · 0.75mm/px · z∈[-135,+60]mm · 9 of 40 slices shown (2 of 2)]
[im 1/40]
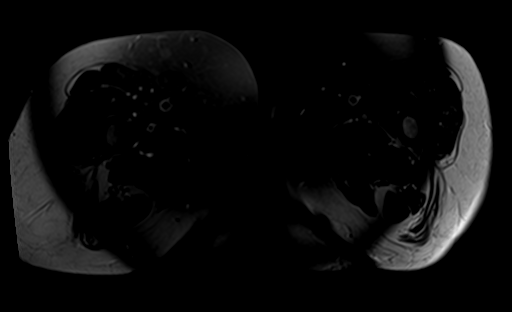
[im 8/40]
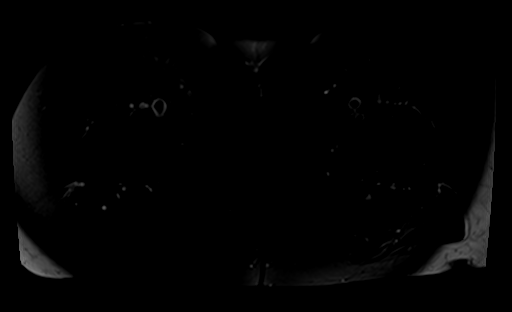
[im 11/40]
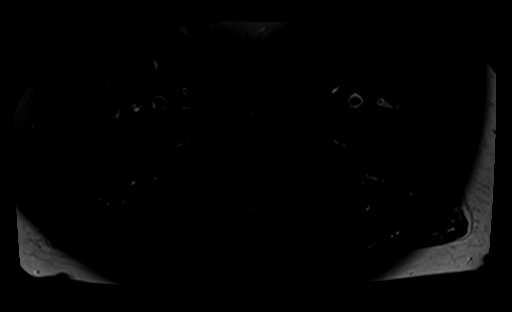
[im 18/40]
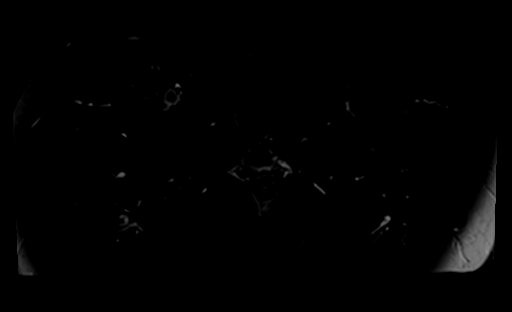
[im 22/40]
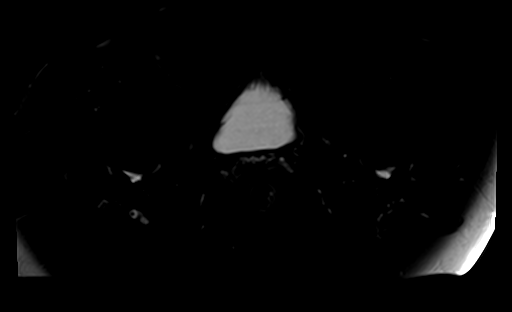
[im 29/40]
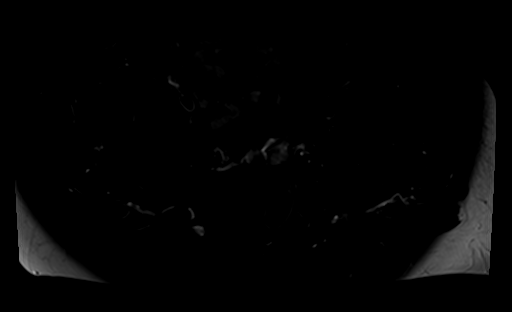
[im 32/40]
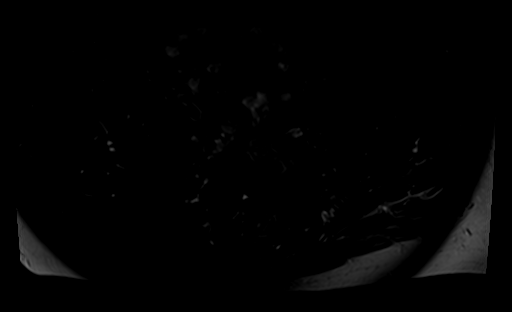
[im 36/40]
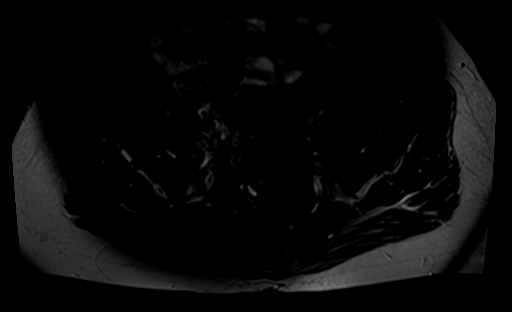
[im 40/40]
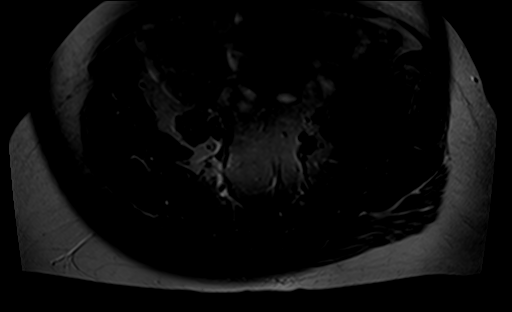

[Series 6: PD fat-sat · sagittal · 4.0mm · 0.82mm/px · 9 of 30 slices shown (1 of 2)]
[im 1/30]
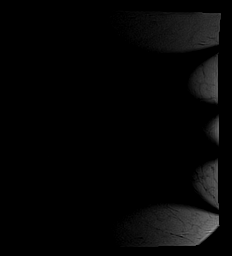
[im 4/30]
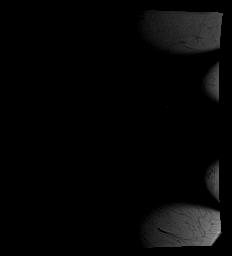
[im 8/30]
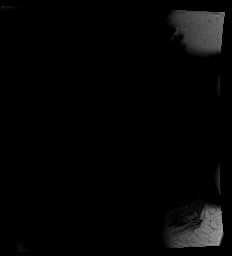
[im 11/30]
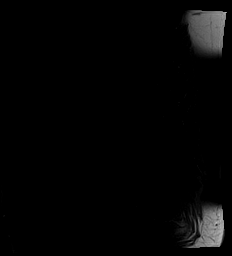
[im 15/30]
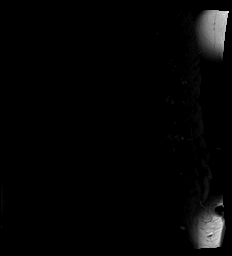
[im 19/30]
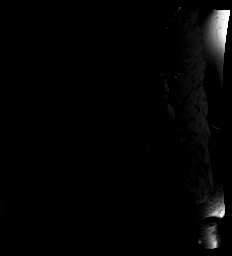
[im 22/30]
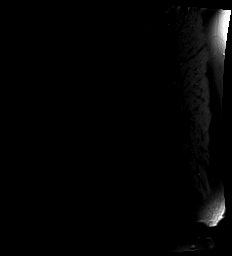
[im 26/30]
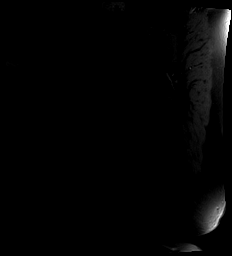
[im 30/30]
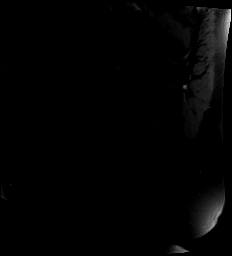

[Series 7: PD fat-sat · coronal · 4.0mm · 0.78mm/px · 7 of 24 slices shown (2 of 2)]
[im 1/24]
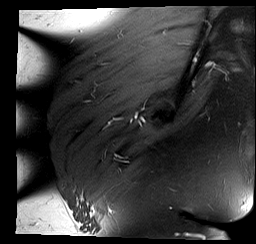
[im 4/24]
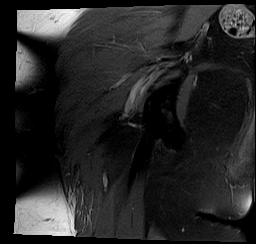
[im 8/24]
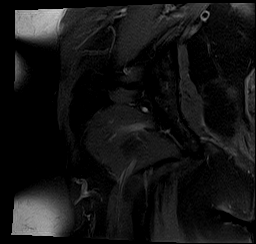
[im 12/24]
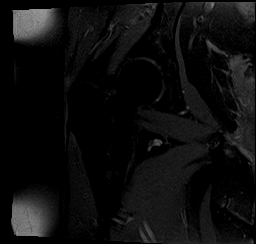
[im 16/24]
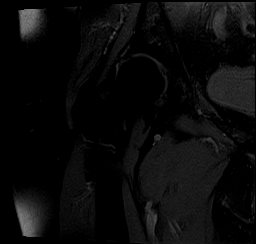
[im 20/24]
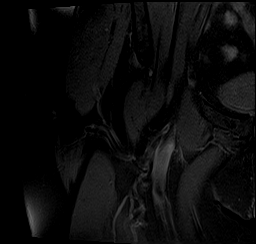
[im 24/24]
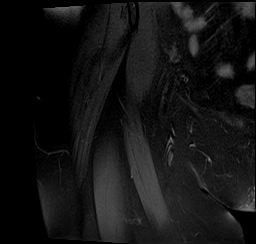

[33 of 40 positions shown; findings below may reference images not displayed]

FINDINGS: Bones: There is no evidence of acute fracture, dislocation or
avascular necrosis. No focal bone lesion. The visualized sacroiliac
joints and symphysis pubis appear normal.

Articular cartilage and labrum

Articular cartilage:  Moderate chondrosis.

Labrum: Degenerative superior labral tearing anteriorly and
posteriorly.

Joint or bursal effusion

Joint effusion: No significant hip joint effusion.

Bursae: Mild right-sided peritrochanteric edema without significant
bursitis. Similar findings on the left.

Muscles and tendons

Muscles and tendons: Mild tendinosis and peritendinitis of the
distal gluteal tendons on the right and the left. There is probable
low-grade insertional tearing of the right gluteus minimus. Mild
tendinosis of the proximal hamstrings bilaterally.There is
intramuscular edema within the right quadratus femoris. There is no
significant ischiofemoral space narrowing.

Other findings

Miscellaneous: Prior hysterectomy.
IMPRESSION: Moderate right hip osteoarthritis with degenerative superior labral
tearing anteriorly and posteriorly.

Mild tendinosis and peritendinitis of the distal gluteal tendons
bilaterally, with low-grade insertion tearing of the right gluteus
minimus at the greater trochanter. Adjacent mild peritrochanteric
edema without significant bursitis.

Mild intramuscular edema within the right quadratus femoris, which
could represent low-grade muscle strain or potentially ischiofemoral
impingement, though with note there is no significant ischiofemoral
space narrowing.

## 2022-09-04 ENCOUNTER — Other Ambulatory Visit: Payer: Self-pay | Admitting: Internal Medicine

## 2022-09-04 DIAGNOSIS — Z1231 Encounter for screening mammogram for malignant neoplasm of breast: Secondary | ICD-10-CM

## 2022-09-19 ENCOUNTER — Telehealth: Payer: Self-pay | Admitting: Gastroenterology

## 2022-09-19 NOTE — Telephone Encounter (Signed)
Good afternoon Dr Fleeta Emmer  Supervising MD 08/31/22 AM  We have received a referral for patient to have a colonoscopy. Patient is requesting transfer due to relocated.  I am sending records for review along with the pathology report. Please review and advise on scheduling.  Thank you

## 2022-09-27 NOTE — Telephone Encounter (Signed)
Called patient to schedule per Dr. Silverio Decamp left voicemail.

## 2022-09-28 ENCOUNTER — Encounter: Payer: Self-pay | Admitting: Gastroenterology

## 2022-10-17 ENCOUNTER — Ambulatory Visit (AMBULATORY_SURGERY_CENTER): Payer: Medicare Other

## 2022-10-17 VITALS — Ht 66.0 in | Wt 174.0 lb

## 2022-10-17 DIAGNOSIS — Z86010 Personal history of colonic polyps: Secondary | ICD-10-CM

## 2022-10-17 MED ORDER — NA SULFATE-K SULFATE-MG SULF 17.5-3.13-1.6 GM/177ML PO SOLN: 1.0000 | Freq: Once | ORAL | 0 refills | Status: AC

## 2022-10-17 NOTE — Progress Notes (Signed)
No egg or soy allergy known to patient;  No issues known to pt with past sedation with any surgeries or procedures; Patient denies ever being told they had issues or difficulty with intubation;  No FH of Malignant Hyperthermia; Pt is not on diet pills; Pt is not on home 02;  Pt is not on blood thinners;  Pt denies issues with constipation  No A fib or A flutter; Have any cardiac testing pending--NO Pt instructed to use Singlecare[employer]com or GoodRx for a price reduction on prep;   Insurance verified during [place] appt=Medicare A/B  Patient's chart reviewed by Osvaldo Angst CNRA prior to previsit and patient appropriate for the [facility].  Previsit completed and red dot placed by patient's name on their procedure day (on provider's schedule).    GoodRx coupon given to patient during [facility] appt; patient agreed to change pharmacies for prep cost reduction;

## 2022-10-26 ENCOUNTER — Ambulatory Visit: Payer: Medicare Other

## 2022-10-27 ENCOUNTER — Ambulatory Visit
Admission: RE | Admit: 2022-10-27 | Discharge: 2022-10-27 | Disposition: A | Discharge status: Home / Self Care | Payer: Medicare Other | Source: Ambulatory Visit | Attending: Internal Medicine | Admitting: Internal Medicine

## 2022-10-27 DIAGNOSIS — Z1231 Encounter for screening mammogram for malignant neoplasm of breast: Secondary | ICD-10-CM

## 2022-11-15 ENCOUNTER — Encounter: Payer: Self-pay | Admitting: Certified Registered Nurse Anesthetist

## 2022-11-17 ENCOUNTER — Ambulatory Visit (AMBULATORY_SURGERY_CENTER): Payer: Medicare Other | Admitting: Gastroenterology

## 2022-11-17 ENCOUNTER — Encounter: Payer: Self-pay | Admitting: Gastroenterology

## 2022-11-17 VITALS — BP 103/70 | HR 53 | Temp 97.4°F | Resp 12 | Ht 66.0 in | Wt 174.0 lb

## 2022-11-17 DIAGNOSIS — D120 Benign neoplasm of cecum: Secondary | ICD-10-CM | POA: Diagnosis not present

## 2022-11-17 DIAGNOSIS — Z09 Encounter for follow-up examination after completed treatment for conditions other than malignant neoplasm: Secondary | ICD-10-CM | POA: Diagnosis not present

## 2022-11-17 DIAGNOSIS — Z86010 Personal history of colonic polyps: Secondary | ICD-10-CM | POA: Diagnosis not present

## 2022-11-17 DIAGNOSIS — Z1211 Encounter for screening for malignant neoplasm of colon: Secondary | ICD-10-CM

## 2022-11-17 DIAGNOSIS — D123 Benign neoplasm of transverse colon: Secondary | ICD-10-CM

## 2022-11-17 DIAGNOSIS — D122 Benign neoplasm of ascending colon: Secondary | ICD-10-CM | POA: Diagnosis not present

## 2022-11-17 MED ORDER — SODIUM CHLORIDE 0.9 % IV SOLN: 500.0000 mL | Freq: Once | INTRAVENOUS | Status: DC

## 2022-11-17 NOTE — Patient Instructions (Signed)
Thank you for coming in to see [place] today! Resume your regular diet and medications today. Return to regular daily activities tomorrow. Repeat colonoscopy in 3-5 years based on pathology results, which will be available in about 2 weeks.   YOU HAD AN ENDOSCOPIC PROCEDURE TODAY AT [facility]:   Refer to the procedure report that was given to you for any specific questions about what was found during the examination.  If the procedure report does not answer your questions, please call your gastroenterologist to clarify.  If you requested that your care partner not be given the details of your procedure findings, then the procedure report has been included in a sealed envelope for you to review at your convenience later.  YOU SHOULD EXPECT: Some feelings of bloating in the abdomen. Passage of more gas than usual.  Walking can help get rid of the air that was put into your GI tract during the procedure and reduce the bloating. If you had a lower endoscopy (such as a colonoscopy or flexible sigmoidoscopy) you may notice spotting of blood in your stool or on the toilet paper. If you underwent a bowel prep for your procedure, you may not have a normal bowel movement for a few days.  Please Note:  You might notice some irritation and congestion in your nose or some drainage.  This is from the oxygen used during your procedure.  There is no need for concern and it should clear up in a day or so.  SYMPTOMS TO REPORT IMMEDIATELY:  Following lower endoscopy (colonoscopy or flexible sigmoidoscopy):  Excessive amounts of blood in the stool  Significant tenderness or worsening of abdominal pains  Swelling of the abdomen that is new, acute  Fever of 100F or higher    For urgent or emergent issues, a gastroenterologist can be reached at any hour by calling [phone]. Do not use MyChart messaging for urgent concerns.    DIET:  We do recommend a small meal at first, but then you may  proceed to your regular diet.  Drink plenty of fluids but you should avoid alcoholic beverages for 24 hours.  ACTIVITY:  You should plan to take it easy for the rest of today and you should NOT DRIVE or use heavy machinery until tomorrow (because of the sedation medicines used during the test).    FOLLOW UP: Our staff will call the number listed on your records the next business day following your procedure.  We will call around 7:15- 8:00 am to check on you and address any questions or concerns that you may have regarding the information given to you following your procedure. If we do not reach you, we will leave a message.     If any biopsies were taken you will be contacted by phone or by letter within the next 1-3 weeks.  Please call us at [phone] if you have not heard about the biopsies in 3 weeks.    SIGNATURES/CONFIDENTIALITY: You and/or your care partner have signed paperwork which will be entered into your electronic medical record.  These signatures attest to the fact that that the information above on your After Visit Summary has been reviewed and is understood.  Full responsibility of the confidentiality of this discharge information lies with you and/or your care-partner.

## 2022-11-17 NOTE — Progress Notes (Signed)
Pt's states no medical or surgical changes since previsit or office visit. 

## 2022-11-17 NOTE — Op Note (Signed)
[place] Patient Name: Carolyn Rangel Procedure Date: 11/17/2022 10:40 AM MRN: [id number] Endoscopist: Mauri Pole , MD, [id number] Age: 67 Referring MD:  Date of Birth: May 06, 1956 Gender: Female Account #: [id number] Procedure:                Colonoscopy Indications:              High risk colon cancer surveillance: Personal                            history of colonic polyps, High risk colon cancer                            surveillance: Personal history of adenoma less than                            10 mm in size, High risk colon cancer surveillance:                            Personal history of sessile serrated colon polyp                            (less than 10 mm in size) with no dysplasia, Last                            colonoscopy: 2017 Medicines:                Monitored Anesthesia Care Procedure:                Pre-Anesthesia Assessment:                           - Prior to the procedure, a History and Physical                            was performed, and patient medications and                            allergies were reviewed. The patient's tolerance of                            previous anesthesia was also reviewed. The risks                            and benefits of the procedure and the sedation                            options and risks were discussed with the patient.                            All questions were answered, and informed consent                            was obtained. Prior Anticoagulants: The patient has  taken no anticoagulant or antiplatelet agents. ASA                            Grade Assessment: II - A patient with mild systemic                            disease. After reviewing the risks and benefits,                            the patient was deemed in satisfactory condition to                            undergo the procedure.                           After obtaining informed consent,  the colonoscope                            was passed under direct vision. Throughout the                            procedure, the patient's blood pressure, pulse, and                            oxygen saturations were monitored continuously. The                            Olympus PCF-H190DL (#[id number]) Colonoscope was                            introduced through the anus and advanced to the the                            cecum, identified by appendiceal orifice and                            ileocecal valve. The colonoscopy was performed                            without difficulty. The patient tolerated the                            procedure well. The quality of the bowel                            preparation was excellent. The ileocecal valve,                            appendiceal orifice, and rectum were photographed. Scope In: 10:59:36 AM Scope Out: 11:26:56 AM Scope Withdrawal Time: 0 hours 20 minutes 5 seconds  Total Procedure Duration: 0 hours 27 minutes 20 seconds  Findings:                 The perianal and digital rectal examinations were  normal.                           Four sessile polyps were found in the transverse                            colon, ascending colon and cecum. The polyps were 4                            to 7 mm in size. These polyps were removed with a                            cold snare. Resection and retrieval were complete.                           Scattered small-mouthed diverticula were found in                            the sigmoid colon, descending colon, transverse                            colon and ascending colon.                           Non-bleeding internal hemorrhoids were found during                            retroflexion. The hemorrhoids were medium-sized. Complications:            No immediate complications. Estimated Blood Loss:     Estimated blood loss was minimal. Impression:               - Four  4 to 7 mm polyps in the transverse colon, in                            the ascending colon and in the cecum, removed with                            a cold snare. Resected and retrieved.                           - Diverticulosis in the sigmoid colon, in the                            descending colon, in the transverse colon and in                            the ascending colon.                           - Non-bleeding internal hemorrhoids. Recommendation:           - Patient has a contact number available for  emergencies. The signs and symptoms of potential                            delayed complications were discussed with the                            patient. Return to normal activities tomorrow.                            Written discharge instructions were provided to the                            patient.                           - Resume previous diet.                           - Continue present medications.                           - Await pathology results.                           - Repeat colonoscopy in 3 - 5 years for                            surveillance based on pathology results. Mauri Pole, MD 11/17/2022 11:32:53 AM This report has been signed electronically.

## 2022-11-17 NOTE — Progress Notes (Signed)
Called to room to assist during endoscopic procedure.  Patient ID and intended procedure confirmed with present staff. Received instructions for my participation in the procedure from the performing physician.  

## 2022-11-17 NOTE — Progress Notes (Signed)
Report given to PACU, vss 

## 2022-11-17 NOTE — Progress Notes (Signed)
[facility] History and Physical   Primary Care Physician:  Velna Hatchet, MD   Reason for Procedure:  History of adenomatous colon polyps  Plan:    Surveillance colonoscopy with possible interventions as needed     HPI: Carolyn Rangel is a very pleasant 67 y.o. female here for surveillance colonoscopy. Denies any nausea, vomiting, abdominal pain, melena or bright red blood per rectum  The risks and benefits as well as alternatives of endoscopic procedure(s) have been discussed and reviewed. All questions answered. The patient agrees to proceed.    Past Medical History:  Diagnosis Date   Cataract    bilateral sx   GERD (gastroesophageal reflux disease)    on meds   Heart murmur    as a child   Hyperlipidemia    on meds   Hypertension    on meds   Osteopenia    Seasonal allergies     Past Surgical History:  Procedure Laterality Date   CATARACT EXTRACTION, BILATERAL  2014   2016   CHOLECYSTECTOMY  2012   TONSILLECTOMY     VAGINAL HYSTERECTOMY  2002    Prior to Admission medications   Medication Sig Start Date End Date Taking? Authorizing Provider  amLODipine (NORVASC) 2.5 MG tablet Take 2.5 mg by mouth daily.   Yes [provider]  Cholecalciferol (VITAMIN D3) 50 MCG (2000 UT) capsule Take 2,000 Units by mouth daily.   Yes [provider]  Coenzyme Q10 (COQ10 PO) Take 300 mg by mouth daily.   Yes [provider]  losartan (COZAAR) 100 MG tablet Take 100 mg by mouth daily. 11/10/21  Yes [provider]  metoprolol succinate (TOPROL-XL) 100 MG 24 hr tablet Take 100 mg by mouth daily. 03/26/17  Yes [provider]  pantoprazole (PROTONIX) 40 MG tablet Take 40 mg by mouth daily. 09/23/21  Yes [provider]  ibuprofen (ADVIL) 200 MG tablet Take 200 mg by mouth every 6 (six) hours as needed for mild pain.    [provider]  promethazine (PHENERGAN) 12.5 MG tablet Take 12.5 mg by mouth  every 8 (eight) hours as needed for nausea. 06/09/22   [provider]  REPATHA 140 MG/ML SOSY Inject 1 mL into the skin every 14 (fourteen) days.    [provider]  zolpidem (AMBIEN CR) 12.5 MG CR tablet Take 12.5 mg by mouth at bedtime as needed for sleep. Patient not taking: Reported on 10/17/2022 10/20/21   [provider]    Current Outpatient Medications  Medication Sig Dispense Refill   amLODipine (NORVASC) 2.5 MG tablet Take 2.5 mg by mouth daily.     Cholecalciferol (VITAMIN D3) 50 MCG (2000 UT) capsule Take 2,000 Units by mouth daily.     Coenzyme Q10 (COQ10 PO) Take 300 mg by mouth daily.     losartan (COZAAR) 100 MG tablet Take 100 mg by mouth daily.     metoprolol succinate (TOPROL-XL) 100 MG 24 hr tablet Take 100 mg by mouth daily.     pantoprazole (PROTONIX) 40 MG tablet Take 40 mg by mouth daily.     ibuprofen (ADVIL) 200 MG tablet Take 200 mg by mouth every 6 (six) hours as needed for mild pain.     promethazine (PHENERGAN) 12.5 MG tablet Take 12.5 mg by mouth every 8 (eight) hours as needed for nausea.     REPATHA 140 MG/ML SOSY Inject 1 mL into the skin every 14 (fourteen) days.     zolpidem (  AMBIEN CR) 12.5 MG CR tablet Take 12.5 mg by mouth at bedtime as needed for sleep. (Patient not taking: Reported on 10/17/2022)     Current Facility-Administered Medications  Medication Dose Route Frequency Provider Last Rate Last Admin   0.9 %  sodium chloride infusion  500 mL Intravenous Once Mauri Pole, MD        Allergies as of 11/17/2022 - Review Complete 11/17/2022  Allergen Reaction Noted   Sulfa antibiotics Anaphylaxis 10/01/2015   Penicillins Hives and Itching 09/14/2007   Simvastatin Other (See Comments) 10/01/2015   Fluoxetine  08/17/2021   Penicillin g  10/17/2022   Pravastatin  08/17/2021   Sulfamethoxazole-trimethoprim Swelling 08/17/2021    Family History  Problem Relation Age of Onset   Breast cancer Mother 37   Colon  polyps Mother 39   Bone cancer Mother 26       mets from breast   Alcoholism Father    Liver disease Father    Heart attack Father 73   Breast cancer Sister 83   Colon cancer Neg Hx    Esophageal cancer Neg Hx    Stomach cancer Neg Hx    Rectal cancer Neg Hx     Social History   Socioeconomic History   Marital status: Married    Spouse name: Not on file   Number of children: Not on file   Years of education: Not on file   Highest education level: Not on file  Occupational History   Not on file  Tobacco Use   Smoking status: Never   Smokeless tobacco: Never  Vaping Use   Vaping Use: Never used  Substance and Sexual Activity   Alcohol use: Not Currently    Alcohol/week: 0.0 - 7.0 standard drinks of alcohol   Drug use: Never   Sexual activity: Not on file  Other Topics Concern   Not on file  Social History Narrative   Not on file   Social Determinants of Health   Financial Resource Strain: Not on file  Food Insecurity: Not on file  Transportation Needs: Not on file  Physical Activity: Not on file  Stress: Not on file  Social Connections: Not on file  Intimate Partner Violence: Not on file    Review of Systems:  All other review of systems negative except as mentioned in the HPI.  Physical Exam: Vital signs in last 24 hours: Blood Pressure (Abnormal) 141/78   Pulse (Abnormal) 54   Temperature (Abnormal) 97.4 F (36.3 C)   Height '5\' 6"'$  (1.676 m)   Weight 174 lb (78.9 kg)   Oxygen Saturation 97%   Body Mass Index 28.08 kg/m  General:   Alert, NAD Lungs:  Clear .   Heart:  Regular rate and rhythm Abdomen:  Soft, nontender and nondistended. Neuro/Psych:  Alert and cooperative. Normal mood and affect. A and O x 3  Reviewed labs, radiology imaging, old records and pertinent past GI work up  Patient is appropriate for planned procedure(s) and anesthesia in an ambulatory setting   K. Denzil Magnuson , MD [phone]

## 2022-11-20 ENCOUNTER — Telehealth: Payer: Self-pay | Admitting: *Deleted

## 2022-11-20 NOTE — Telephone Encounter (Signed)
  Follow up Call-     11/17/2022   10:40 AM  Call back number  Post procedure Call Back phone  # [phone]  Permission to leave phone message Yes     Patient questions:  Do you have a fever, pain , or abdominal swelling? No. Pain Score  0 *  Have you tolerated food without any problems? Yes.    Have you been able to return to your normal activities? Yes.    Do you have any questions about your discharge instructions: Diet   No. Medications  No. Follow up visit  No.  Do you have questions or concerns about your Care? No.  Actions: * If pain score is 4 or above: No action needed, pain <4.

## 2022-11-20 NOTE — Progress Notes (Signed)
Error. No addendum made.

## 2022-11-23 ENCOUNTER — Encounter: Payer: Self-pay | Admitting: Gastroenterology

## 2023-01-18 ENCOUNTER — Other Ambulatory Visit: Payer: Self-pay | Admitting: Obstetrics and Gynecology

## 2023-03-03 ENCOUNTER — Encounter (HOSPITAL_BASED_OUTPATIENT_CLINIC_OR_DEPARTMENT_OTHER): Payer: Self-pay | Admitting: Emergency Medicine

## 2023-03-03 ENCOUNTER — Other Ambulatory Visit: Payer: Self-pay

## 2023-03-03 ENCOUNTER — Emergency Department (HOSPITAL_BASED_OUTPATIENT_CLINIC_OR_DEPARTMENT_OTHER)
Admission: EM | Admit: 2023-03-03 | Discharge: 2023-03-03 | Disposition: A | Discharge status: Home / Self Care | Payer: Medicare Other | Attending: Emergency Medicine | Admitting: Emergency Medicine

## 2023-03-03 ENCOUNTER — Emergency Department (HOSPITAL_BASED_OUTPATIENT_CLINIC_OR_DEPARTMENT_OTHER): Payer: Medicare Other

## 2023-03-03 DIAGNOSIS — Z79899 Other long term (current) drug therapy: Secondary | ICD-10-CM | POA: Insufficient documentation

## 2023-03-03 DIAGNOSIS — D72829 Elevated white blood cell count, unspecified: Secondary | ICD-10-CM | POA: Diagnosis not present

## 2023-03-03 DIAGNOSIS — I10 Essential (primary) hypertension: Secondary | ICD-10-CM | POA: Insufficient documentation

## 2023-03-03 DIAGNOSIS — R197 Diarrhea, unspecified: Secondary | ICD-10-CM | POA: Diagnosis present

## 2023-03-03 DIAGNOSIS — K5730 Diverticulosis of large intestine without perforation or abscess without bleeding: Secondary | ICD-10-CM | POA: Insufficient documentation

## 2023-03-03 DIAGNOSIS — K760 Fatty (change of) liver, not elsewhere classified: Secondary | ICD-10-CM | POA: Diagnosis not present

## 2023-03-03 DIAGNOSIS — Z9049 Acquired absence of other specified parts of digestive tract: Secondary | ICD-10-CM | POA: Insufficient documentation

## 2023-03-03 LAB — URINALYSIS, ROUTINE W REFLEX MICROSCOPIC
Bilirubin Urine: NEGATIVE
Glucose, UA: NEGATIVE mg/dL
Hgb urine dipstick: NEGATIVE
Nitrite: NEGATIVE
Protein, ur: NEGATIVE mg/dL
Specific Gravity, Urine: 1.009 (ref 1.005–1.030)
pH: 5.5 (ref 5.0–8.0)

## 2023-03-03 LAB — COMPREHENSIVE METABOLIC PANEL
ALT: 40 U/L (ref 0–44)
AST: 48 U/L — ABNORMAL HIGH (ref 15–41)
Albumin: 4.7 g/dL (ref 3.5–5.0)
Alkaline Phosphatase: 113 U/L (ref 38–126)
Anion gap: 12 (ref 5–15)
BUN: 19 mg/dL (ref 8–23)
CO2: 20 mmol/L — ABNORMAL LOW (ref 22–32)
Calcium: 10.5 mg/dL — ABNORMAL HIGH (ref 8.9–10.3)
Chloride: 104 mmol/L (ref 98–111)
Creatinine, Ser: 1.22 mg/dL — ABNORMAL HIGH (ref 0.44–1.00)
GFR, Estimated: 49 mL/min — ABNORMAL LOW (ref >60)
Glucose, Bld: 138 mg/dL — ABNORMAL HIGH (ref 70–99)
Potassium: 3.7 mmol/L (ref 3.5–5.1)
Sodium: 136 mmol/L (ref 135–145)
Total Bilirubin: 0.6 mg/dL (ref 0.3–1.2)
Total Protein: 7.3 g/dL (ref 6.5–8.1)

## 2023-03-03 LAB — CBC
HCT: 39.7 % (ref 36.0–46.0)
Hemoglobin: 13.7 g/dL (ref 12.0–15.0)
MCH: 32.5 pg (ref 26.0–34.0)
MCHC: 34.5 g/dL (ref 30.0–36.0)
MCV: 94.3 fL (ref 80.0–100.0)
Platelets: 269 10*3/uL (ref 150–400)
RBC: 4.21 MIL/uL (ref 3.87–5.11)
RDW: 12.7 % (ref 11.5–15.5)
WBC: 11.3 10*3/uL — ABNORMAL HIGH (ref 4.0–10.5)
nRBC: 0 % (ref 0.0–0.2)

## 2023-03-03 LAB — LIPASE, BLOOD: Lipase: 96 U/L — ABNORMAL HIGH (ref 11–51)

## 2023-03-03 MED ORDER — SODIUM CHLORIDE 0.9 % IV BOLUS
1000.0000 mL | Freq: Once | INTRAVENOUS | Status: AC
  Administered 2023-03-03: 1000 mL via INTRAVENOUS

## 2023-03-03 MED ORDER — ONDANSETRON HCL 4 MG PO TABS: 4.0000 mg | ORAL_TABLET | Freq: Four times a day (QID) | ORAL | 0 refills | Status: DC

## 2023-03-03 MED ORDER — IOHEXOL 300 MG/ML  SOLN
100.0000 mL | Freq: Once | INTRAMUSCULAR | Status: AC | PRN
  Administered 2023-03-03: 100 mL via INTRAVENOUS

## 2023-03-03 MED ORDER — ONDANSETRON HCL 4 MG/2ML IJ SOLN
4.0000 mg | Freq: Once | INTRAMUSCULAR | Status: AC
  Administered 2023-03-03: 4 mg via INTRAVENOUS
  Filled 2023-03-03: qty 2

## 2023-03-03 MED ORDER — FENTANYL CITRATE PF 50 MCG/ML IJ SOSY
50.0000 ug | PREFILLED_SYRINGE | Freq: Once | INTRAMUSCULAR | Status: AC
  Administered 2023-03-03: 50 ug via INTRAVENOUS
  Filled 2023-03-03: qty 1

## 2023-03-03 NOTE — ED Triage Notes (Signed)
Pt arrives to ED with c/o diarrhea x9 days and abdominal pain that started today.

## 2023-03-03 NOTE — ED Provider Notes (Signed)
[facility] EMERGENCY DEPARTMENT AT [facility] Provider Note   CSN: [id number] Arrival date & time: 03/03/23  1717     History {Add pertinent medical, surgical, social history, OB history to HPI:1} Chief Complaint  Patient presents with   Abdominal Pain   Diarrhea    Carolyn Rangel is a 67 y.o. female with history of osteopenia, GERD, hypertension, hyperlipidemia who presents the emergency department complaining of diarrhea and abdominal pain.  States diarrhea has been going on for about 9 days, and then started having abdominal pain today. Recently on numerous courses of antibiotics for different conditions in the past ~ 6 months. PCP is concerned for C diff.    Abdominal Pain Associated symptoms: diarrhea   Diarrhea Associated symptoms: abdominal pain        Home Medications Prior to Admission medications   Medication Sig Start Date End Date Taking? Authorizing Provider  amLODipine (NORVASC) 2.5 MG tablet Take 2.5 mg by mouth daily.    [provider]  Cholecalciferol (VITAMIN D3) 50 MCG (2000 UT) capsule Take 2,000 Units by mouth daily.    [provider]  Coenzyme Q10 (COQ10 PO) Take 300 mg by mouth daily.    [provider]  ibuprofen (ADVIL) 200 MG tablet Take 200 mg by mouth every 6 (six) hours as needed for mild pain.    [provider]  losartan (COZAAR) 100 MG tablet Take 100 mg by mouth daily. 11/10/21   [provider]  metoprolol succinate (TOPROL-XL) 100 MG 24 hr tablet Take 100 mg by mouth daily. 03/26/17   [provider]  pantoprazole (PROTONIX) 40 MG tablet Take 40 mg by mouth daily. 09/23/21   [provider]  promethazine (PHENERGAN) 12.5 MG tablet Take 12.5 mg by mouth every 8 (eight) hours as needed for nausea. 06/09/22   [provider]  REPATHA 140 MG/ML SOSY Inject 1 mL into the skin every 14 (fourteen) days.    [provider]  zolpidem (AMBIEN CR) 12.5 MG CR  tablet Take 12.5 mg by mouth at bedtime as needed for sleep. Patient not taking: Reported on 10/17/2022 10/20/21   [provider]      Allergies    Sulfa antibiotics, Penicillins, Simvastatin, Fluoxetine, Penicillin g, Pravastatin, and Sulfamethoxazole-trimethoprim    Review of Systems   Review of Systems  Gastrointestinal:  Positive for abdominal pain and diarrhea.    Physical Exam Updated Vital Signs BP (!) 157/97 (BP Location: Right Arm)   Pulse 75   Temp 97.9 F (36.6 C)   Resp (!) 22   Ht  (1.676 m)   Wt 76.2 kg   SpO2 100%   BMI 27.12 kg/m  Physical Exam  ED Results / Procedures / Treatments   Labs (all labs ordered are listed, but only abnormal results are displayed) Labs Reviewed  LIPASE, BLOOD - Abnormal; Notable for the following components:      Result Value   Lipase 96 (*)    All other components within normal limits  COMPREHENSIVE METABOLIC PANEL - Abnormal; Notable for the following components:   CO2 20 (*)    Glucose, Bld 138 (*)    Creatinine, Ser 1.22 (*)    Calcium 10.5 (*)    AST 48 (*)    GFR, Estimated 49 (*)    All other components within normal limits  CBC - Abnormal; Notable for the following components:   WBC 11.3 (*)    All other components within normal  limits  URINALYSIS, ROUTINE W REFLEX MICROSCOPIC    EKG None  Radiology No results found.  Procedures Procedures  {Document cardiac monitor, telemetry assessment procedure when appropriate:1}  Medications Ordered in ED Medications - No data to display  ED Course/ Medical Decision Making/ A&P   {   Click here for ABCD2, HEART and other calculatorsREFRESH Note before signing :1}                          Medical Decision Making Amount and/or Complexity of Data Reviewed Labs: ordered. Radiology: ordered.  Risk Prescription drug management.   This patient is a 67 y.o. female  who presents to the ED for concern of diarrhea.   Differential diagnoses prior to  evaluation: The emergent differential diagnosis includes, but is not limited to,  Infectious causes such as: Viral, Bacterial, Parasitic; Toxin. Noninfectious causes such as: GI Bleed, Appendicitis, Mesenteric Ischemia, Diverticulitis, endocrine causes (adrenal, thyroid), Toxicologic exposures, Drug-associated, IBD. This is not an exhaustive differential.   Past Medical History / Co-morbidities: ***  Additional history: Chart reviewed. Pertinent results include: ***  Physical Exam: Physical exam performed. The pertinent findings include: ***  Lab Tests/Imaging studies: I personally interpreted labs/imaging and the pertinent results include:  ***. ***I agree with the radiologist interpretation.  Cardiac monitoring: EKG obtained and interpreted by my attending physician which shows: ***   Medications: I ordered medication including ***.  I have reviewed the patients home medicines and have made adjustments as needed.   Disposition: After consideration of the diagnostic results and the patients response to treatment, I feel that *** .   ***emergency department workup does not suggest an emergent condition requiring admission or immediate intervention beyond what has been performed at this time. The plan is: ***. The patient is safe for discharge and has been instructed to return immediately for worsening symptoms, change in symptoms or any other concerns.  Final Clinical Impression(s) / ED Diagnoses Final diagnoses:  None    Rx / DC Orders ED Discharge Orders     None      Portions of this report may have been transcribed using voice recognition software. Every effort was made to ensure accuracy; however, inadvertent computerized transcription errors may be present.

## 2023-03-03 NOTE — Discharge Instructions (Signed)
You were seen in the ER for ongoing diarrhea and abdominal pain.  As we discussed your blood work was overall reassuring.  Your lipase level was slightly elevated, but I think this is likely in the setting of the infection you have going on.  This should resolve on its own.  Your CT scan did not show any other sources for your symptoms.  I have sent some nausea medication to your pharmacy.  You can take pain medication as needed.  For collecting a stool sample, I think would be in your best interest to collect the sample at home and bring it to your doctor's office on Monday.  If you bring it back to our facility tomorrow, there is no way to confirm it will be run and the results be sent to your primary doctor.  If you are able to collect the sample tomorrow, seal it in the bag that we have given you, and keep it in the fridge.  Bring it to your doctor's office first thing on Monday and they will be able to send it out for testing.  Continue to monitor how you're doing and return to the ER for new or worsening symptoms.

## 2023-03-06 ENCOUNTER — Telehealth: Payer: Self-pay | Admitting: *Deleted

## 2023-03-06 NOTE — Telephone Encounter (Signed)
     Patient  visit on 02/2023  at [facility]  was for treatment  Have you been able to follow up with your primary care physician? Still having alot of nausea is able to get her medicication and her transportation saw pcp this am  The patient was  to obtain any needed medicine or equipment.  Are there diet recommendations that you are having difficulty following?  Patient expresses understanding of discharge instructions and education provided has no other needs at this time.    Yehuda Mao Greenauer -[facility], Population Health [phone] [address] [place] , [place] [address] Email : [email or web address]

## 2023-08-06 ENCOUNTER — Other Ambulatory Visit: Payer: Self-pay | Admitting: Orthopedic Surgery

## 2023-08-06 DIAGNOSIS — M25552 Pain in left hip: Secondary | ICD-10-CM

## 2023-08-07 ENCOUNTER — Ambulatory Visit
Admission: RE | Admit: 2023-08-07 | Discharge: 2023-08-07 | Disposition: A | Discharge status: Home / Self Care | Payer: Medicare Other | Source: Ambulatory Visit | Attending: Orthopedic Surgery | Admitting: Orthopedic Surgery

## 2023-08-07 DIAGNOSIS — M25552 Pain in left hip: Secondary | ICD-10-CM

## 2023-08-22 ENCOUNTER — Other Ambulatory Visit: Payer: Self-pay | Admitting: Orthopedic Surgery

## 2023-08-22 DIAGNOSIS — M545 Low back pain, unspecified: Secondary | ICD-10-CM

## 2023-09-14 ENCOUNTER — Other Ambulatory Visit: Payer: Self-pay | Admitting: Internal Medicine

## 2023-09-14 DIAGNOSIS — Z1231 Encounter for screening mammogram for malignant neoplasm of breast: Secondary | ICD-10-CM

## 2023-09-15 ENCOUNTER — Other Ambulatory Visit: Payer: Medicare Other

## 2023-10-29 ENCOUNTER — Ambulatory Visit
Admission: RE | Admit: 2023-10-29 | Discharge: 2023-10-29 | Disposition: A | Discharge status: Home / Self Care | Payer: Medicare Other | Source: Ambulatory Visit | Attending: Internal Medicine | Admitting: Internal Medicine

## 2023-10-29 DIAGNOSIS — Z1231 Encounter for screening mammogram for malignant neoplasm of breast: Secondary | ICD-10-CM

## 2023-12-23 ENCOUNTER — Other Ambulatory Visit: Payer: Medicare Other

## 2023-12-27 ENCOUNTER — Other Ambulatory Visit: Payer: Medicare Other

## 2024-01-12 ENCOUNTER — Ambulatory Visit
Admission: RE | Admit: 2024-01-12 | Discharge: 2024-01-12 | Disposition: A | Discharge status: Home / Self Care | Payer: Medicare Other | Source: Ambulatory Visit | Attending: Orthopedic Surgery | Admitting: Orthopedic Surgery

## 2024-01-12 DIAGNOSIS — M545 Low back pain, unspecified: Secondary | ICD-10-CM

## 2024-04-10 ENCOUNTER — Other Ambulatory Visit: Payer: Self-pay | Admitting: Neurosurgery

## 2024-04-17 NOTE — Progress Notes (Signed)
 Surgical Instructions   Your procedure is scheduled on Wednesday, June 18th. Report to [facility] Main Entrance "A" at 6:30 A.M., then check in with the Admitting office. Any questions or running late day of surgery: call [phone]  Questions prior to your surgery date: call [phone], Monday-Friday, 8am-4pm. If you experience any cold or flu symptoms such as cough, fever, chills, shortness of breath, etc. between now and your scheduled surgery, please notify us  at the above number.     Remember:  Do not eat after midnight the night before your surgery  You may drink clear liquids until 5:30 the morning of your surgery.   Clear liquids allowed are: Water, Non-Citrus Juices (without pulp), Carbonated Beverages, Clear Tea (no milk, honey, etc.), Black Coffee Only (NO MILK, CREAM OR POWDERED CREAMER of any kind), and Gatorade.    Take these medicines the morning of surgery with A SIP OF WATER  metoprolol succinate (TOPROL-XL)  pantoprazole (PROTONIX)    One week prior to surgery, STOP taking any Aspirin (unless otherwise instructed by your surgeon) Aleve, Naproxen, Motrin, Goody's, BC's, all herbal medications, fish oil, and non-prescription vitamins. This includes your ibuprofen (ADVIL).                     Do NOT Smoke (Tobacco/Vaping) for 24 hours prior to your procedure.  If you use a CPAP at night, you may bring your mask/headgear for your overnight stay.   You will be asked to remove any contacts, glasses, piercing's, hearing aid's, dentures/partials prior to surgery. Please bring cases for these items if needed.    Patients discharged the day of surgery will not be allowed to drive home, and someone needs to stay with them for 24 hours.  SURGICAL WAITING ROOM VISITATION Patients may have no more than 2 support people in the waiting area - these visitors may rotate.   Pre-op nurse will coordinate an appropriate time for 1 ADULT support person, who may not rotate, to  accompany patient in pre-op.  Children under the age of 96 must have an adult with them who is not the patient and must remain in the main waiting area with an adult.  If the patient needs to stay at the hospital during part of their recovery, the visitor guidelines for inpatient rooms apply.  Please refer to the [facility] website for the visitor guidelines for any additional information.   If you received a COVID test during your pre-op visit  it is requested that you wear a mask when out in public, stay away from anyone that may not be feeling well and notify your surgeon if you develop symptoms. If you have been in contact with anyone that has tested positive in the last 10 days please notify you surgeon.      Pre-operative 5 CHG Bathing Instructions   You can play a key role in reducing the risk of infection after surgery. Your skin needs to be as free of germs as possible. You can reduce the number of germs on your skin by washing with CHG (chlorhexidine gluconate) soap before surgery. CHG is an antiseptic soap that kills germs and continues to kill germs even after washing.   DO NOT use if you have an allergy to chlorhexidine/CHG or antibacterial soaps. If your skin becomes reddened or irritated, stop using the CHG and notify one of our RNs at [phone].   Please shower with the CHG soap starting 4 days before surgery using the following  schedule:     Please keep in mind the following:  DO NOT shave, including legs and underarms, starting the day of your first shower.   You may shave your face at any point before/day of surgery.  Place clean sheets on your bed the day you start using CHG soap. Use a clean washcloth (not used since being washed) for each shower. DO NOT sleep with pets once you start using the CHG.   CHG Shower Instructions:  Wash your face and private area with normal soap. If you choose to wash your hair, wash first with your normal shampoo.  After you use  shampoo/soap, rinse your hair and body thoroughly to remove shampoo/soap residue.  Turn the water OFF and apply about 3 tablespoons (45 ml) of CHG soap to a CLEAN washcloth.  Apply CHG soap ONLY FROM YOUR NECK DOWN TO YOUR TOES (washing for 3-5 minutes)  DO NOT use CHG soap on face, private areas, open wounds, or sores.  Pay special attention to the area where your surgery is being performed.  If you are having back surgery, having someone wash your back for you may be helpful. Wait 2 minutes after CHG soap is applied, then you may rinse off the CHG soap.  Pat dry with a clean towel  Put on clean clothes/pajamas   If you choose to wear lotion, please use ONLY the CHG-compatible lotions that are listed below.  Additional instructions for the day of surgery: DO NOT APPLY any lotions, deodorants, cologne, or perfumes.   Do not bring valuables to the hospital. [facility] is not responsible for any belongings/valuables. Do not wear nail polish, gel polish, artificial nails, or any other type of covering on natural nails (fingers and toes) Do not wear jewelry or makeup Put on clean/comfortable clothes.  Please brush your teeth.  Ask your nurse before applying any prescription medications to the skin.     CHG Compatible Lotions   Aveeno Moisturizing lotion  Cetaphil Moisturizing Cream  Cetaphil Moisturizing Lotion  Clairol Herbal Essence Moisturizing Lotion, Dry Skin  Clairol Herbal Essence Moisturizing Lotion, Extra Dry Skin  Clairol Herbal Essence Moisturizing Lotion, Normal Skin  Curel Age Defying Therapeutic Moisturizing Lotion with Alpha Hydroxy  Curel Extreme Care Body Lotion  Curel Soothing Hands Moisturizing Hand Lotion  Curel Therapeutic Moisturizing Cream, Fragrance-Free  Curel Therapeutic Moisturizing Lotion, Fragrance-Free  Curel Therapeutic Moisturizing Lotion, Original Formula  Eucerin Daily Replenishing Lotion  Eucerin Dry Skin Therapy Plus Alpha Hydroxy Crme  Eucerin  Dry Skin Therapy Plus Alpha Hydroxy Lotion  Eucerin Original Crme  Eucerin Original Lotion  Eucerin Plus Crme Eucerin Plus Lotion  Eucerin TriLipid Replenishing Lotion  Keri Anti-Bacterial Hand Lotion  Keri Deep Conditioning Original Lotion Dry Skin Formula Softly Scented  Keri Deep Conditioning Original Lotion, Fragrance Free Sensitive Skin Formula  Keri Lotion Fast Absorbing Fragrance Free Sensitive Skin Formula  Keri Lotion Fast Absorbing Softly Scented Dry Skin Formula  Keri Original Lotion  Keri Skin Renewal Lotion Keri Silky Smooth Lotion  Keri Silky Smooth Sensitive Skin Lotion  Nivea Body Creamy Conditioning Oil  Nivea Body Extra Enriched Lotion  Nivea Body Original Lotion  Nivea Body Sheer Moisturizing Lotion Nivea Crme  Nivea Skin Firming Lotion  NutraDerm 30 Skin Lotion  NutraDerm Skin Lotion  NutraDerm Therapeutic Skin Cream  NutraDerm Therapeutic Skin Lotion  ProShield Protective Hand Cream  Provon moisturizing lotion  Please read over the following fact sheets that you were given.

## 2024-04-17 NOTE — Progress Notes (Signed)
 Left message with Sherian Dimitri at Dr. Bartholomew Light office for surgical orders

## 2024-04-18 ENCOUNTER — Encounter (HOSPITAL_COMMUNITY): Payer: Self-pay

## 2024-04-18 ENCOUNTER — Encounter (HOSPITAL_COMMUNITY)
Admission: RE | Admit: 2024-04-18 | Discharge: 2024-04-18 | Disposition: A | Discharge status: Home / Self Care | Source: Ambulatory Visit | Attending: Neurosurgery | Admitting: Neurosurgery

## 2024-04-18 ENCOUNTER — Other Ambulatory Visit: Payer: Self-pay

## 2024-04-18 VITALS — Ht 66.0 in

## 2024-04-18 DIAGNOSIS — Z01818 Encounter for other preprocedural examination: Secondary | ICD-10-CM | POA: Diagnosis present

## 2024-04-18 DIAGNOSIS — K219 Gastro-esophageal reflux disease without esophagitis: Secondary | ICD-10-CM | POA: Diagnosis not present

## 2024-04-18 DIAGNOSIS — M4316 Spondylolisthesis, lumbar region: Secondary | ICD-10-CM | POA: Diagnosis not present

## 2024-04-18 DIAGNOSIS — Z01812 Encounter for preprocedural laboratory examination: Secondary | ICD-10-CM | POA: Insufficient documentation

## 2024-04-18 DIAGNOSIS — Z9889 Other specified postprocedural states: Secondary | ICD-10-CM | POA: Diagnosis not present

## 2024-04-18 DIAGNOSIS — E785 Hyperlipidemia, unspecified: Secondary | ICD-10-CM | POA: Diagnosis not present

## 2024-04-18 DIAGNOSIS — I10 Essential (primary) hypertension: Secondary | ICD-10-CM | POA: Insufficient documentation

## 2024-04-18 LAB — SURGICAL PCR SCREEN
MRSA, PCR: NEGATIVE
Staphylococcus aureus: NEGATIVE

## 2024-04-18 LAB — BASIC METABOLIC PANEL WITH GFR
Anion gap: 11 (ref 5–15)
BUN: 13 mg/dL (ref 8–23)
CO2: 27 mmol/L (ref 22–32)
Calcium: 9.9 mg/dL (ref 8.9–10.3)
Chloride: 105 mmol/L (ref 98–111)
Creatinine, Ser: 1.13 mg/dL — ABNORMAL HIGH (ref 0.44–1.00)
GFR, Estimated: 53 mL/min — ABNORMAL LOW (ref >60)
Glucose, Bld: 111 mg/dL — ABNORMAL HIGH (ref 70–99)
Potassium: 5 mmol/L (ref 3.5–5.1)
Sodium: 143 mmol/L (ref 135–145)

## 2024-04-18 LAB — CBC
HCT: 41.6 % (ref 36.0–46.0)
Hemoglobin: 13.5 g/dL (ref 12.0–15.0)
MCH: 32.2 pg (ref 26.0–34.0)
MCHC: 32.5 g/dL (ref 30.0–36.0)
MCV: 99.3 fL (ref 80.0–100.0)
Platelets: 275 10*3/uL (ref 150–400)
RBC: 4.19 MIL/uL (ref 3.87–5.11)
RDW: 12.8 % (ref 11.5–15.5)
WBC: 6.5 10*3/uL (ref 4.0–10.5)
nRBC: 0 % (ref 0.0–0.2)

## 2024-04-18 LAB — TYPE AND SCREEN
ABO/RH(D): A NEG
Antibody Screen: NEGATIVE

## 2024-04-18 NOTE — Progress Notes (Addendum)
 PCP - Dr. Barnetta Liberty Cardiologist - Alois Arnt LOV - 11-21-21 - follow up as needed  PPM/ICD - Denies Device Orders - n/a Rep Notified - n/a  Chest x-ray - n/a EKG - 03-26-24 Stress Test - 04-23-17 ECHO - 04-26-17 Cardiac Cath - 07-2011  Sleep Study - Denies CPAP - n/a  NON-diabetic  Last dose of GLP1 agonist-  Denies GLP1 instructions: n/a  Blood Thinner Instructions:Denies Aspirin Instructions:Denies  ERAS Protcol - clears until 5:30 PRE-SURGERY Ensure or G2- none  COVID TEST- n/a   Anesthesia review: Yes, HTN.  Requested EKG tracing (03-26-24) from [facility].  Patient denies shortness of breath, fever, cough and chest pain at PAT appointment. Patient denies any respiratory issues at this time.    All instructions explained to the patient, with a verbal understanding of the material. Patient agrees to go over the instructions while at home for a better understanding. Patient also instructed to self quarantine after being tested for COVID-19. The opportunity to ask questions was provided.

## 2024-04-21 NOTE — Progress Notes (Addendum)
 Anesthesia Chart Review:  Case: [id number] Date/Time: 04/30/24 0815   Procedure: POSTERIOR LUMBAR FUSION 1 LEVEL (Back) - PLIF - L4-L5 Alphatec for cortical screws and interbody - Interbody Fusion   Anesthesia type: General   Diagnosis: Spondylolisthesis, lumbar region [M43.16]   Pre-op diagnosis: Spondylolisthesis lumbar region   Location: [facility] ROOM 20 / [facility]   Surgeons: Gearl Keens, MD       DISCUSSION: Patient is a 68 year old female scheduled for the above procedure.   History includes never smoker, HTN, HLD, childhood murmur (no significant valvular disease TTE 2018), GERD, hysterectomy (2002), cholecystectomy (2012).   Requested 03/26/24 EKG from PCP office. (UPDATE: 04/28/24 3:37 PM: EKG tracing received and is scanned under Media tab.)  Anesthesia team to evaluate on the day of surgery.   VS: Pulse (!) (P) 58   Temp (P) 36.8 C   Resp (P) 17   Ht 5' 6 (1.676 m)   Wt (P) 79.5 kg   SpO2 (P) 97%   BMI (P) 28.29 kg/m  BP 142/70 on 03/26/24 ([facility])   PROVIDERS: Barnetta Liberty, MD is PCP - [facility]) - Evaluation by cardiologist Alois Arnt, MD on 11/21/21 for atypical chest pain. CAC 0 in 2017. Unremarkable stress and echo in 2018. As needed follow-up advised.   LABS: Preoperative labs noted. AST 36, ALT 32 on 08/21/23 (all labs ordered are listed, but only abnormal results are displayed)  Labs Reviewed  BASIC METABOLIC PANEL WITH GFR - Abnormal; Notable for the following components:      Result Value   Glucose, Bld 111 (*)    Creatinine, Ser 1.13 (*)    GFR, Estimated 53 (*)    All other components within normal limits  SURGICAL PCR SCREEN  CBC  TYPE AND SCREEN     IMAGES: MRI L-spine 01/12/24: IMPRESSION: Lumbar spine degeneration focally advanced at L4-5 where there is bulky facet spurring and anterolisthesis. L4-5 moderate to advanced spinal stenosis.    EKG: EKG 03/26/24 ([facility]): NSR. Probable anterior infarct. Minor right precordial  repolarization disturbance, consider ischemia. Minor right lateral repolarization disturbance, consider ischemia. Ordering provider felt EKG was stable.  - T wave abnormalities appear stable when compared to 11/15/21 and 11/21/20 tracings. There is reverse r wave progression in V1 and V2 on most recent tracing.    CV: US  Carotid 10/10/21: Summary:  - Right Carotid: There is no evidence of stenosis in the right ICA.  - Left Carotid: There is no evidence of stenosis in the left ICA.  - Vertebrals:  Bilateral vertebral arteries demonstrate antegrade flow.  - Subclavians: Normal flow hemodynamics were seen in bilateral subclavian arteries.   Echo 04/26/17 ([facility]): Conclusions   Summary   The left atrium is mildly enlarged.   There is normal left ventricular size.   Global left ventricular systolic function is normal.   The estimated left ventricular ejection fraction is 66% biplane   Normal left ventricular diastolic filling pattern.   Normal right ventricle size and function. Normal RVSP   No significant stenotic nor regurgitant lesions present  (Trivial MR/TR)  Nuclear stress test 56/11/18 ([facility]): CONCLUSIONS:  1. Normal left ventricular wall motion and segmental function.  2. The post stress ejection fraction is measured at 76 %.  3. Normal myocardial perfusion study without evidence of inducible ischemia.  4. Clinically and electrocardiographically negative for ischemia at a maximal workload.   CT Coronary Calcium Scoring 02/17/16 ([facility]): FINDINGS:  Left Main  Artery Score: 0  Left Anterior Descending Artery Score: 0  Left Circumflex Artery Score: 0  Right Coronary Artery Score: 0  The coronary artery calcium Agatston Score is 0.    Past Medical History:  Diagnosis Date   Cataract    bilateral sx   GERD (gastroesophageal reflux disease)    on meds   Heart murmur    as a child   Hyperlipidemia    on meds   Hypertension    on meds   Osteopenia    Seasonal  allergies     Past Surgical History:  Procedure Laterality Date   CATARACT EXTRACTION, BILATERAL  2014   2016   CHOLECYSTECTOMY  2012   TONSILLECTOMY     VAGINAL HYSTERECTOMY  2002    MEDICATIONS:  Cholecalciferol (VITAMIN D3) 50 MCG (2000 UT) capsule   Coenzyme Q10 (COQ10 PO)   ibuprofen (ADVIL) 200 MG tablet   losartan (COZAAR) 100 MG tablet   metoprolol succinate (TOPROL-XL) 100 MG 24 hr tablet   ondansetron  (ZOFRAN ) 4 MG tablet   pantoprazole (PROTONIX) 40 MG tablet   REPATHA SURECLICK 140 MG/ML SOAJ   No current facility-administered medications for this encounter.    Ella Gun, PA-C Surgical Short Stay/Anesthesiology [facility] Phone [phone] [facility] Phone [phone] 04/21/2024 4:30 PM

## 2024-04-28 NOTE — Anesthesia Preprocedure Evaluation (Signed)
 Anesthesia Evaluation  Patient identified by MRN, date of birth, ID band Patient awake    Reviewed: Allergy & Precautions, NPO status , Patient's Chart, lab work & pertinent test results  Airway Mallampati: III  TM Distance: <3 FB Neck ROM: Full    Dental  (+) Teeth Intact, Dental Advisory Given   Pulmonary neg pulmonary ROS   breath sounds clear to auscultation       Cardiovascular hypertension, Pt. on home beta blockers and Pt. on medications + Valvular Problems/Murmurs  Rhythm:Regular Rate:Normal     Neuro/Psych  negative psych ROS   GI/Hepatic Neg liver ROS,GERD  Medicated,,  Endo/Other  negative endocrine ROS    Renal/GU negative Renal ROS     Musculoskeletal negative musculoskeletal ROS (+)    Abdominal   Peds  Hematology negative hematology ROS (+)   Anesthesia Other Findings   Reproductive/Obstetrics                             Anesthesia Physical Anesthesia Plan  ASA: 2  Anesthesia Plan: General   Post-op Pain Management: Tylenol PO (pre-op)*   Induction: Intravenous  PONV Risk Score and Plan: 4 or greater and Ondansetron , Dexamethasone, Midazolam and Scopolamine patch - Pre-op  Airway Management Planned: Oral ETT  Additional Equipment: None  Intra-op Plan:   Post-operative Plan: Extubation in OR  Informed Consent: I have reviewed the patients History and Physical, chart, labs and discussed the procedure including the risks, benefits and alternatives for the proposed anesthesia with the patient or authorized representative who has indicated his/her understanding and acceptance.     Dental advisory given  Plan Discussed with: CRNA  Anesthesia Plan Comments: (PAT note written by Docie Abramovich, PA-C.  )       Anesthesia Quick Evaluation

## 2024-04-30 ENCOUNTER — Ambulatory Visit (HOSPITAL_COMMUNITY): Payer: Self-pay | Admitting: Vascular Surgery

## 2024-04-30 ENCOUNTER — Ambulatory Visit (HOSPITAL_COMMUNITY)
Admission: RE | Admit: 2024-04-30 | Discharge: 2024-05-01 | Disposition: A | Discharge status: Home / Self Care | Attending: Neurosurgery | Admitting: Neurosurgery

## 2024-04-30 ENCOUNTER — Encounter (HOSPITAL_COMMUNITY)
Admission: RE | Disposition: A | Discharge status: Home / Self Care | Payer: Self-pay | Source: Home / Self Care | Attending: Neurosurgery

## 2024-04-30 ENCOUNTER — Ambulatory Visit (HOSPITAL_COMMUNITY): Admitting: Anesthesiology

## 2024-04-30 ENCOUNTER — Encounter (HOSPITAL_COMMUNITY): Payer: Self-pay | Admitting: Neurosurgery

## 2024-04-30 ENCOUNTER — Ambulatory Visit (HOSPITAL_COMMUNITY)

## 2024-04-30 ENCOUNTER — Other Ambulatory Visit: Payer: Self-pay

## 2024-04-30 DIAGNOSIS — M8580 Other specified disorders of bone density and structure, unspecified site: Secondary | ICD-10-CM | POA: Insufficient documentation

## 2024-04-30 DIAGNOSIS — K219 Gastro-esophageal reflux disease without esophagitis: Secondary | ICD-10-CM | POA: Diagnosis not present

## 2024-04-30 DIAGNOSIS — M4316 Spondylolisthesis, lumbar region: Secondary | ICD-10-CM

## 2024-04-30 DIAGNOSIS — I10 Essential (primary) hypertension: Secondary | ICD-10-CM | POA: Insufficient documentation

## 2024-04-30 DIAGNOSIS — M48061 Spinal stenosis, lumbar region without neurogenic claudication: Secondary | ICD-10-CM | POA: Diagnosis not present

## 2024-04-30 LAB — ABO/RH: ABO/RH(D): A NEG

## 2024-04-30 SURGERY — POSTERIOR LUMBAR FUSION 1 LEVEL
Anesthesia: General | Site: Back

## 2024-04-30 MED ORDER — THROMBIN 20000 UNITS EX SOLR: CUTANEOUS | Status: DC | PRN

## 2024-04-30 MED ORDER — SCOPOLAMINE 1 MG/3DAYS TD PT72
1.0000 | MEDICATED_PATCH | TRANSDERMAL | Status: DC
  Administered 2024-04-30: 1 via TRANSDERMAL

## 2024-04-30 MED ORDER — ONDANSETRON HCL 4 MG PO TABS: 4.0000 mg | ORAL_TABLET | Freq: Four times a day (QID) | ORAL | Status: DC

## 2024-04-30 MED ORDER — DEXMEDETOMIDINE HCL IN NACL 80 MCG/20ML IV SOLN
INTRAVENOUS | Status: AC
  Filled 2024-04-30: qty 20

## 2024-04-30 MED ORDER — HYDROMORPHONE HCL 1 MG/ML IJ SOLN
INTRAMUSCULAR | Status: AC
  Filled 2024-04-30: qty 1

## 2024-04-30 MED ORDER — HYDROCODONE-ACETAMINOPHEN 5-325 MG PO TABS
2.0000 | ORAL_TABLET | ORAL | Status: DC | PRN
  Administered 2024-04-30 – 2024-05-01 (×5): 2 via ORAL
  Filled 2024-04-30 (×5): qty 2

## 2024-04-30 MED ORDER — COQ10 30 MG PO CAPS: 300.0000 mg | ORAL_CAPSULE | Freq: Every day | ORAL | Status: DC

## 2024-04-30 MED ORDER — DEXAMETHASONE SODIUM PHOSPHATE 10 MG/ML IJ SOLN
INTRAMUSCULAR | Status: AC
  Filled 2024-04-30: qty 1

## 2024-04-30 MED ORDER — ORAL CARE MOUTH RINSE: 15.0000 mL | Freq: Once | OROMUCOSAL | Status: AC

## 2024-04-30 MED ORDER — PHENOL 1.4 % MT LIQD: 1.0000 | OROMUCOSAL | Status: DC | PRN

## 2024-04-30 MED ORDER — ARTIFICIAL TEARS OPHTHALMIC OINT
TOPICAL_OINTMENT | OPHTHALMIC | Status: AC
  Filled 2024-04-30: qty 3.5

## 2024-04-30 MED ORDER — VITAMIN D 25 MCG (1000 UNIT) PO TABS: 2000.0000 [IU] | ORAL_TABLET | Freq: Every day | ORAL | Status: DC

## 2024-04-30 MED ORDER — CHLORHEXIDINE GLUCONATE 0.12 % MT SOLN
15.0000 mL | Freq: Once | OROMUCOSAL | Status: AC
  Administered 2024-04-30: 15 mL via OROMUCOSAL
  Filled 2024-04-30: qty 15

## 2024-04-30 MED ORDER — ACETAMINOPHEN 10 MG/ML IV SOLN: 1000.0000 mg | Freq: Once | INTRAVENOUS | Status: DC | PRN

## 2024-04-30 MED ORDER — MIDAZOLAM HCL 2 MG/2ML IJ SOLN
INTRAMUSCULAR | Status: AC
  Filled 2024-04-30: qty 2

## 2024-04-30 MED ORDER — PHENYLEPHRINE 80 MCG/ML (10ML) SYRINGE FOR IV PUSH (FOR BLOOD PRESSURE SUPPORT)
PREFILLED_SYRINGE | INTRAVENOUS | Status: AC
  Filled 2024-04-30: qty 10

## 2024-04-30 MED ORDER — ONDANSETRON HCL 4 MG/2ML IJ SOLN
INTRAMUSCULAR | Status: DC | PRN
  Administered 2024-04-30: 4 mg via INTRAVENOUS

## 2024-04-30 MED ORDER — CYCLOBENZAPRINE HCL 10 MG PO TABS
10.0000 mg | ORAL_TABLET | Freq: Three times a day (TID) | ORAL | Status: DC | PRN
  Administered 2024-04-30: 10 mg via ORAL
  Filled 2024-04-30: qty 1

## 2024-04-30 MED ORDER — ACETAMINOPHEN 500 MG PO TABS: 1000.0000 mg | ORAL_TABLET | Freq: Once | ORAL | Status: DC

## 2024-04-30 MED ORDER — ALBUMIN HUMAN 5 % IV SOLN
INTRAVENOUS | Status: DC | PRN
Start: 2024-04-30 — End: 2024-04-30

## 2024-04-30 MED ORDER — EPHEDRINE 5 MG/ML INJ
INTRAVENOUS | Status: AC
  Filled 2024-04-30: qty 5

## 2024-04-30 MED ORDER — 0.9 % SODIUM CHLORIDE (POUR BTL) OPTIME
TOPICAL | Status: DC | PRN
  Administered 2024-04-30: 2000 mL

## 2024-04-30 MED ORDER — HYDROMORPHONE HCL 1 MG/ML IJ SOLN: 0.5000 mg | INTRAMUSCULAR | Status: DC | PRN

## 2024-04-30 MED ORDER — DEXMEDETOMIDINE HCL IN NACL 80 MCG/20ML IV SOLN
INTRAVENOUS | Status: DC | PRN
  Administered 2024-04-30 (×3): 4 ug via INTRAVENOUS

## 2024-04-30 MED ORDER — HYDROMORPHONE HCL 1 MG/ML IJ SOLN
0.2500 mg | INTRAMUSCULAR | Status: DC | PRN
  Administered 2024-04-30 (×2): 0.5 mg via INTRAVENOUS

## 2024-04-30 MED ORDER — LIDOCAINE 2% (20 MG/ML) 5 ML SYRINGE
INTRAMUSCULAR | Status: DC | PRN
  Administered 2024-04-30: 80 mg via INTRAVENOUS

## 2024-04-30 MED ORDER — PANTOPRAZOLE SODIUM 40 MG PO TBEC: 40.0000 mg | DELAYED_RELEASE_TABLET | Freq: Every day | ORAL | Status: DC

## 2024-04-30 MED ORDER — DEXAMETHASONE SODIUM PHOSPHATE 10 MG/ML IJ SOLN
INTRAMUSCULAR | Status: DC | PRN
  Administered 2024-04-30: 10 mg via INTRAVENOUS

## 2024-04-30 MED ORDER — ONDANSETRON HCL 4 MG PO TABS: 4.0000 mg | ORAL_TABLET | Freq: Four times a day (QID) | ORAL | Status: DC | PRN

## 2024-04-30 MED ORDER — SODIUM CHLORIDE 0.9 % IV SOLN
250.0000 mL | INTRAVENOUS | Status: DC
Start: 2024-04-30 — End: 2024-05-01
  Administered 2024-04-30: 250 mL via INTRAVENOUS

## 2024-04-30 MED ORDER — ACETAMINOPHEN 650 MG RE SUPP: 650.0000 mg | RECTAL | Status: DC | PRN

## 2024-04-30 MED ORDER — BUPIVACAINE LIPOSOME 1.3 % IJ SUSP
INTRAMUSCULAR | Status: DC | PRN
Start: 2024-04-30 — End: 2024-04-30
  Administered 2024-04-30: 20 mL

## 2024-04-30 MED ORDER — FENTANYL CITRATE (PF) 250 MCG/5ML IJ SOLN
INTRAMUSCULAR | Status: DC | PRN
  Administered 2024-04-30: 100 ug via INTRAVENOUS
  Administered 2024-04-30 (×2): 50 ug via INTRAVENOUS

## 2024-04-30 MED ORDER — EVOLOCUMAB 140 MG/ML ~~LOC~~ SOAJ: 140.0000 mg | SUBCUTANEOUS | Status: DC

## 2024-04-30 MED ORDER — MEPERIDINE HCL 25 MG/ML IJ SOLN: 6.2500 mg | INTRAMUSCULAR | Status: DC | PRN

## 2024-04-30 MED ORDER — LACTATED RINGERS IV SOLN: INTRAVENOUS | Status: DC

## 2024-04-30 MED ORDER — CEFAZOLIN SODIUM 1 G IJ SOLR
INTRAMUSCULAR | Status: AC
  Filled 2024-04-30: qty 20

## 2024-04-30 MED ORDER — SODIUM CHLORIDE 0.9% FLUSH
3.0000 mL | Freq: Two times a day (BID) | INTRAVENOUS | Status: DC
Start: 2024-04-30 — End: 2024-05-01
  Administered 2024-04-30: 3 mL via INTRAVENOUS

## 2024-04-30 MED ORDER — ACETAMINOPHEN 325 MG PO TABS: 650.0000 mg | ORAL_TABLET | ORAL | Status: DC | PRN

## 2024-04-30 MED ORDER — EPHEDRINE SULFATE-NACL 50-0.9 MG/10ML-% IV SOSY
PREFILLED_SYRINGE | INTRAVENOUS | Status: DC | PRN
  Administered 2024-04-30: 5 mg via INTRAVENOUS

## 2024-04-30 MED ORDER — PROPOFOL 10 MG/ML IV BOLUS
INTRAVENOUS | Status: DC | PRN
  Administered 2024-04-30: 120 mg via INTRAVENOUS

## 2024-04-30 MED ORDER — CEFAZOLIN SODIUM-DEXTROSE 2-4 GM/100ML-% IV SOLN
2.0000 g | Freq: Three times a day (TID) | INTRAVENOUS | Status: DC
  Administered 2024-04-30 – 2024-05-01 (×3): 2 g via INTRAVENOUS
  Filled 2024-04-30 (×3): qty 100

## 2024-04-30 MED ORDER — PROPOFOL 10 MG/ML IV BOLUS
INTRAVENOUS | Status: AC
  Filled 2024-04-30: qty 20

## 2024-04-30 MED ORDER — PANTOPRAZOLE SODIUM 40 MG IV SOLR: 40.0000 mg | Freq: Every day | INTRAVENOUS | Status: DC

## 2024-04-30 MED ORDER — ALUM & MAG HYDROXIDE-SIMETH 200-200-20 MG/5ML PO SUSP: 30.0000 mL | Freq: Four times a day (QID) | ORAL | Status: DC | PRN

## 2024-04-30 MED ORDER — OXYCODONE HCL 5 MG PO TABS: 5.0000 mg | ORAL_TABLET | Freq: Once | ORAL | Status: DC | PRN

## 2024-04-30 MED ORDER — MENTHOL 3 MG MT LOZG: 1.0000 | LOZENGE | OROMUCOSAL | Status: DC | PRN

## 2024-04-30 MED ORDER — BUPIVACAINE HCL (PF) 0.25 % IJ SOLN
INTRAMUSCULAR | Status: AC
  Filled 2024-04-30: qty 30

## 2024-04-30 MED ORDER — LIDOCAINE 2% (20 MG/ML) 5 ML SYRINGE
INTRAMUSCULAR | Status: AC
  Filled 2024-04-30: qty 5

## 2024-04-30 MED ORDER — FENTANYL CITRATE (PF) 250 MCG/5ML IJ SOLN
INTRAMUSCULAR | Status: AC
  Filled 2024-04-30: qty 5

## 2024-04-30 MED ORDER — LOSARTAN POTASSIUM 50 MG PO TABS
50.0000 mg | ORAL_TABLET | Freq: Every day | ORAL | Status: DC
  Administered 2024-04-30: 50 mg via ORAL
  Filled 2024-04-30: qty 1

## 2024-04-30 MED ORDER — CEFAZOLIN SODIUM-DEXTROSE 2-3 GM-%(50ML) IV SOLR
INTRAVENOUS | Status: DC | PRN
  Administered 2024-04-30: 2 g via INTRAVENOUS

## 2024-04-30 MED ORDER — ONDANSETRON HCL 4 MG/2ML IJ SOLN: 4.0000 mg | Freq: Four times a day (QID) | INTRAMUSCULAR | Status: DC | PRN

## 2024-04-30 MED ORDER — ROCURONIUM BROMIDE 10 MG/ML (PF) SYRINGE
PREFILLED_SYRINGE | INTRAVENOUS | Status: AC
  Filled 2024-04-30: qty 10

## 2024-04-30 MED ORDER — MIDAZOLAM HCL 2 MG/2ML IJ SOLN
INTRAMUSCULAR | Status: DC | PRN
  Administered 2024-04-30: 2 mg via INTRAVENOUS

## 2024-04-30 MED ORDER — SODIUM CHLORIDE 0.9% FLUSH: 3.0000 mL | INTRAVENOUS | Status: DC | PRN

## 2024-04-30 MED ORDER — FENTANYL CITRATE (PF) 250 MCG/5ML IJ SOLN
INTRAMUSCULAR | Status: AC
Start: 2024-04-30 — End: 2024-04-30
  Filled 2024-04-30: qty 5

## 2024-04-30 MED ORDER — DROPERIDOL 2.5 MG/ML IJ SOLN: 0.6250 mg | Freq: Once | INTRAMUSCULAR | Status: DC | PRN

## 2024-04-30 MED ORDER — OXYCODONE HCL 5 MG/5ML PO SOLN: 5.0000 mg | Freq: Once | ORAL | Status: DC | PRN

## 2024-04-30 MED ORDER — ROCURONIUM BROMIDE 10 MG/ML (PF) SYRINGE
PREFILLED_SYRINGE | INTRAVENOUS | Status: DC | PRN
  Administered 2024-04-30 (×2): 10 mg via INTRAVENOUS
  Administered 2024-04-30: 60 mg via INTRAVENOUS
  Administered 2024-04-30: 20 mg via INTRAVENOUS

## 2024-04-30 MED ORDER — ONDANSETRON HCL 4 MG/2ML IJ SOLN
INTRAMUSCULAR | Status: AC
  Filled 2024-04-30: qty 2

## 2024-04-30 MED ORDER — BUPIVACAINE LIPOSOME 1.3 % IJ SUSP
INTRAMUSCULAR | Status: AC
  Filled 2024-04-30: qty 20

## 2024-04-30 MED ORDER — METOPROLOL SUCCINATE ER 100 MG PO TB24
100.0000 mg | ORAL_TABLET | Freq: Every day | ORAL | Status: DC
  Administered 2024-04-30: 100 mg via ORAL
  Filled 2024-04-30: qty 1

## 2024-04-30 MED ORDER — THROMBIN 20000 UNITS EX SOLR
CUTANEOUS | Status: AC
  Filled 2024-04-30: qty 20000

## 2024-04-30 MED ORDER — SUGAMMADEX SODIUM 200 MG/2ML IV SOLN
INTRAVENOUS | Status: DC | PRN
  Administered 2024-04-30: 200 mg via INTRAVENOUS

## 2024-04-30 MED ORDER — ACETAMINOPHEN 10 MG/ML IV SOLN
INTRAVENOUS | Status: DC | PRN
Start: 2024-04-30 — End: 2024-04-30
  Administered 2024-04-30: 1000 mg via INTRAVENOUS

## 2024-04-30 MED ORDER — LIDOCAINE-EPINEPHRINE 1 %-1:100000 IJ SOLN
INTRAMUSCULAR | Status: DC | PRN
  Administered 2024-04-30: 10 mL

## 2024-04-30 MED ORDER — LIDOCAINE-EPINEPHRINE 1 %-1:100000 IJ SOLN
INTRAMUSCULAR | Status: AC
Start: 2024-04-30 — End: 2024-04-30
  Filled 2024-04-30: qty 1

## 2024-04-30 SURGICAL SUPPLY — 67 items
BAG COUNTER SPONGE SURGICOUNT (BAG) ×2 IMPLANT
BASKET BONE COLLECTION (BASKET) ×2 IMPLANT
BENZOIN TINCTURE PRP APPL 2/3 (GAUZE/BANDAGES/DRESSINGS) ×2 IMPLANT
BIT DRILL INV  3.1 (DRILL) IMPLANT
BLADE BONE MILL MEDIUM (MISCELLANEOUS) ×2 IMPLANT
BLADE CLIPPER SURG (BLADE) IMPLANT
BLADE SURG 11 STRL SS (BLADE) ×2 IMPLANT
BONE VIVIGEN FORMABLE 5.4CC (Bone Implant) ×1 IMPLANT
BUR CUTTER 7.0 ROUND (BURR) ×2 IMPLANT
BUR MATCHSTICK NEURO 3.0 LAGG (BURR) ×2 IMPLANT
CANISTER SUCTION 3000ML PPV (SUCTIONS) ×2 IMPLANT
CNTNR URN SCR LID CUP LEK RST (MISCELLANEOUS) ×2 IMPLANT
COVER BACK TABLE 60X90IN (DRAPES) ×2 IMPLANT
DERMABOND ADVANCED .7 DNX12 (GAUZE/BANDAGES/DRESSINGS) ×2 IMPLANT
DRAPE C-ARM 42X72 X-RAY (DRAPES) ×4 IMPLANT
DRAPE C-ARMOR (DRAPES) IMPLANT
DRAPE HALF SHEET 40X57 (DRAPES) IMPLANT
DRAPE LAPAROTOMY 100X72X124 (DRAPES) ×2 IMPLANT
DRAPE SURG 17X23 STRL (DRAPES) ×2 IMPLANT
DRSG OPSITE 4X5.5 SM (GAUZE/BANDAGES/DRESSINGS) ×2 IMPLANT
DRSG OPSITE POSTOP 4X6 (GAUZE/BANDAGES/DRESSINGS) ×2 IMPLANT
DURAPREP 26ML APPLICATOR (WOUND CARE) ×2 IMPLANT
ELECTRODE REM PT RTRN 9FT ADLT (ELECTROSURGICAL) ×2 IMPLANT
EVACUATOR 1/8 PVC DRAIN (DRAIN) IMPLANT
GAUZE 4X4 16PLY ~~LOC~~+RFID DBL (SPONGE) IMPLANT
GAUZE SPONGE 4X4 12PLY STRL (GAUZE/BANDAGES/DRESSINGS) ×2 IMPLANT
GAUZE SPONGE 4X4 12PLY STRL LF (GAUZE/BANDAGES/DRESSINGS) IMPLANT
GLOVE BIO SURGEON STRL SZ7 (GLOVE) IMPLANT
GLOVE BIO SURGEON STRL SZ8 (GLOVE) ×4 IMPLANT
GLOVE BIOGEL PI IND STRL 7.0 (GLOVE) IMPLANT
GLOVE BIOGEL PI IND STRL 7.5 (GLOVE) IMPLANT
GLOVE EXAM NITRILE XL STR (GLOVE) IMPLANT
GLOVE INDICATOR 8.5 STRL (GLOVE) ×4 IMPLANT
GLOVE SURG SS PI 7.5 STRL IVOR (GLOVE) IMPLANT
GOWN STRL REUS W/ TWL LRG LVL3 (GOWN DISPOSABLE) IMPLANT
GOWN STRL REUS W/ TWL XL LVL3 (GOWN DISPOSABLE) ×4 IMPLANT
GOWN STRL REUS W/TWL 2XL LVL3 (GOWN DISPOSABLE) IMPLANT
GRAFT BNE MATRIX VG FRMBL MD 5 (Bone Implant) IMPLANT
KIT BASIN OR (CUSTOM PROCEDURE TRAY) ×2 IMPLANT
KIT TURNOVER KIT B (KITS) ×2 IMPLANT
MILL BONE PREP (MISCELLANEOUS) ×2 IMPLANT
NDL HYPO 25X1 1.5 SAFETY (NEEDLE) ×2 IMPLANT
NEEDLE HYPO 25X1 1.5 SAFETY (NEEDLE) ×1 IMPLANT
NS IRRIG 1000ML POUR BTL (IV SOLUTION) ×2 IMPLANT
PACK LAMINECTOMY NEURO (CUSTOM PROCEDURE TRAY) ×2 IMPLANT
PAD ARMBOARD POSITIONER FOAM (MISCELLANEOUS) ×6 IMPLANT
PATTIES SURGICAL .5 X.5 (GAUZE/BANDAGES/DRESSINGS) ×2 IMPLANT
PATTIES SURGICAL 1X1 (DISPOSABLE) ×2 IMPLANT
ROD LORD LIPPED TI 5.5X35 (Rod) IMPLANT
SCREW CANC SHANK MOD 5.5X30 (Screw) IMPLANT
SCREW POLYAXIAL TULIP (Screw) IMPLANT
SET SCREW SPNE (Screw) IMPLANT
SPACER IDENTITI 9X10X22 10D (Spacer) IMPLANT
SPIKE FLUID TRANSFER (MISCELLANEOUS) ×2 IMPLANT
SPONGE SURGIFOAM ABS GEL 100 (HEMOSTASIS) ×2 IMPLANT
SPONGE T-LAP 4X18 ~~LOC~~+RFID (SPONGE) IMPLANT
STRIP CLOSURE SKIN 1/2X4 (GAUZE/BANDAGES/DRESSINGS) ×4 IMPLANT
SUT VIC AB 0 CT1 18XCR BRD8 (SUTURE) ×4 IMPLANT
SUT VIC AB 2-0 CT1 18 (SUTURE) ×2 IMPLANT
SUT VIC AB 4-0 PS2 27 (SUTURE) ×2 IMPLANT
SYR 20ML LL LF (SYRINGE) IMPLANT
SYR 30ML SLIP (SYRINGE) ×2 IMPLANT
TOWEL GREEN STERILE (TOWEL DISPOSABLE) ×2 IMPLANT
TOWEL GREEN STERILE FF (TOWEL DISPOSABLE) ×2 IMPLANT
TRAY FOLEY MTR SLVR 14FR STAT (SET/KITS/TRAYS/PACK) IMPLANT
TRAY FOLEY MTR SLVR 16FR STAT (SET/KITS/TRAYS/PACK) ×2 IMPLANT
WATER STERILE IRR 1000ML POUR (IV SOLUTION) ×2 IMPLANT

## 2024-04-30 NOTE — Transfer of Care (Signed)
 Immediate Anesthesia Transfer of Care Note  Patient: Carolyn Rangel  Procedure(s) Performed: POSTERIOR LUMBAR INTERBODY FUSION, LUMBAR FOUR-FIVE, WITH SCREWS, RODS, AND CAGES (Back)  Patient Location: PACU  Anesthesia Type:General  Level of Consciousness: awake, alert , and oriented  Airway & Oxygen Therapy: Patient Spontanous Breathing and Patient connected to face mask oxygen  Post-op Assessment: Report given to RN and Post -op Vital signs reviewed and stable  Post vital signs: Reviewed and stable  Last Vitals:  Vitals Value Taken Time  BP 115/63 04/30/24 11:34  Temp 98.1 1133  Pulse 69 04/30/24 11:36  Resp 20 04/30/24 11:36  SpO2 95 % 04/30/24 11:36  Vitals shown include unfiled device data.  Last Pain:  Vitals:   04/30/24 0728  TempSrc:   PainSc: 7       Patients Stated Pain Goal: 3 (04/30/24 4098)  Complications: There were no known notable events for this encounter.

## 2024-04-30 NOTE — Anesthesia Postprocedure Evaluation (Signed)
 Anesthesia Post Note  Patient: MERCADIES CO  Procedure(s) Performed: POSTERIOR LUMBAR INTERBODY FUSION, LUMBAR FOUR-FIVE, WITH SCREWS, RODS, AND CAGES (Back)     Patient location during evaluation: PACU Anesthesia Type: General Level of consciousness: awake and alert Pain management: pain level controlled Vital Signs Assessment: post-procedure vital signs reviewed and stable Respiratory status: spontaneous breathing, nonlabored ventilation, respiratory function stable and patient connected to nasal cannula oxygen Cardiovascular status: blood pressure returned to baseline and stable Postop Assessment: no apparent nausea or vomiting Anesthetic complications: no  There were no known notable events for this encounter.  Last Vitals:  Vitals:   04/30/24 1249 04/30/24 1555  BP: (!) 149/89 133/70  Pulse: 60 61  Resp: 18 17  Temp: 36.5 C 36.7 C  SpO2: 95% 95%    Last Pain:  Vitals:   04/30/24 1555  TempSrc: Oral  PainSc:                  Makhya Arave D Kile Kabler

## 2024-04-30 NOTE — Anesthesia Procedure Notes (Addendum)
 Procedure Name: Intubation Date/Time: 04/30/2024 8:46 AM  Performed by: Jamas Maywood, CRNAPre-anesthesia Checklist: Patient identified, Emergency Drugs available, Suction available and Patient being monitored Patient Re-evaluated:Patient Re-evaluated prior to induction Oxygen Delivery Method: Circle system utilized Preoxygenation: Pre-oxygenation with 100% oxygen Induction Type: IV induction Ventilation: Mask ventilation without difficulty and Oral airway inserted - appropriate to patient size Laryngoscope Size: Mac and 3 Grade View: Grade II Tube type: Oral Tube size: 7.0 mm Number of attempts: 1 Airway Equipment and Method: Stylet and Oral airway Placement Confirmation: ETT inserted through vocal cords under direct vision, positive ETCO2 and breath sounds checked- equal and bilateral Secured at: 22 cm Tube secured with: Tape Dental Injury: Teeth and Oropharynx as per pre-operative assessment and Injury to lip  Comments: Intubation performed by Mittie Anchors. Small lip abrasion noted. Topical ointment applied.

## 2024-04-30 NOTE — H&P (Signed)
 Carolyn Rangel is an 68 y.o. female.   Chief Complaint: Back and left leg pain HPI: 68 year old female progressive worsening back and left leg pain workup revealed grade 1 spondylolisthesis with instability at L4-5 and due to the patient's progression of clinical syndrome imaging findings and failed conservative treatment we recommended decompressive laminectomy interbody fusion at that level.  We extensively reviewed the risks and benefits of the operation with her as well as perioperative course expectations of outcome and alternatives to surgery and she understood and agreed to proceed forward.  Past Medical History:  Diagnosis Date   Cataract    bilateral sx   GERD (gastroesophageal reflux disease)    on meds   Heart murmur    as a child   Hyperlipidemia    on meds   Hypertension    on meds   Osteopenia    Seasonal allergies     Past Surgical History:  Procedure Laterality Date   CATARACT EXTRACTION, BILATERAL  2014   2016   CHOLECYSTECTOMY  2012   TONSILLECTOMY     VAGINAL HYSTERECTOMY  2002    Family History  Problem Relation Age of Onset   Breast cancer Mother 33   Colon polyps Mother 50   Bone cancer Mother 35       mets from breast   Alcoholism Father    Liver disease Father    Heart attack Father 47   Breast cancer Sister 33   Colon cancer Neg Hx    Esophageal cancer Neg Hx    Stomach cancer Neg Hx    Rectal cancer Neg Hx    Social History:  reports that she has never smoked. She has never used smokeless tobacco. She reports that she does not currently use alcohol. She reports that she does not use drugs.  Allergies:  Allergies  Allergen Reactions   Sulfa Antibiotics Anaphylaxis   Penicillins Hives and Itching   Simvastatin Other (See Comments)    Other reaction(s): Other (See Comments)  Elevated LFT's  Other Reaction(s): ELEVATED LFTs   Fluoxetine     Other reaction(s): Other (See Comments) feel strange  Other Reaction(s): groggy/dizzy    Oxycodone Other (See Comments)    Makes her Jittery   Penicillin G     Other Reaction(s): can tolerate cephalosporins   Pravastatin     Other reaction(s): Other (See Comments) joint pain  Other Reaction(s): myalgias   Sulfamethoxazole-Trimethoprim Swelling    Other reaction(s): Other (See Comments) itching    Medications Prior to Admission  Medication Sig Dispense Refill   Cholecalciferol (VITAMIN D3) 50 MCG (2000 UT) capsule Take 2,000 Units by mouth daily.     Coenzyme Q10 (COQ10 PO) Take 300 mg by mouth daily.     ibuprofen (ADVIL) 200 MG tablet Take 200 mg by mouth every 6 (six) hours as needed for mild pain.     losartan (COZAAR) 100 MG tablet Take 50 mg by mouth daily.     metoprolol succinate (TOPROL-XL) 100 MG 24 hr tablet Take 100 mg by mouth daily.     pantoprazole (PROTONIX) 40 MG tablet Take 40 mg by mouth daily.     REPATHA SURECLICK 140 MG/ML SOAJ Inject 140 mg into the skin every 14 (fourteen) days.     ondansetron  (ZOFRAN ) 4 MG tablet Take 1 tablet (4 mg total) by mouth every 6 (six) hours. (Patient not taking: Reported on 04/15/2024) 12 tablet 0    No results found for this or any  previous visit (from the past 48 hours). No results found.  Review of Systems  Musculoskeletal:  Positive for back pain.  Neurological:  Positive for numbness.    Blood pressure (!) 149/78, pulse 61, temperature 98 F (36.7 C), temperature source Oral, resp. rate 18, height 5' 6 (1.676 m), weight 79.5 kg, SpO2 96%. Physical Exam HENT:     Head: Normocephalic.     Right Ear: Tympanic membrane normal.     Nose: Nose normal.     Mouth/Throat:     Mouth: Mucous membranes are moist.   Cardiovascular:     Rate and Rhythm: Normal rate.     Pulses: Normal pulses.  Pulmonary:     Effort: Pulmonary effort is normal.   Musculoskeletal:        General: Normal range of motion.     Cervical back: Normal range of motion.   Skin:    General: Skin is warm.   Neurological:      Comments: Strength is 5 out of 5 iliopsoas, quads, hamstrings, gastroc, and tibialis, and EHL.     Assessment/Plan 68 year old presents for decompressive laminectomy interbody fusion L4-5  Ferris Hua, MD 04/30/2024, 7:56 AM

## 2024-04-30 NOTE — Op Note (Signed)
 Preoperative diagnosis: Grade 1 spondylolisthesis severe spinal stenosis L4-5.  Postoperative diagnosis: Same.  Procedure: Bilateral hemilaminectomies complete medial facetectomies and foraminotomies of the L4 and L5 nerve roots in excess and requiring more work than we would be needed with a standard interbody. Fusion.  2.  Posterior lumbar interbody fusion utilizing the Alphatec insert and rotate titanium cages packed with locally harvested autograft mixed with Vivigen.  3.  Cortical screw fixation utilizing the Alphatec modular cortical screw set.  Surgeon: Ambrosio Junker Axel Frisk  Assistant: Audie Bleacher.  Anesthesia: General.  EBL: Minimal.  HPI: 68 year old female progressive worsening back and bilateral hip and leg pain worse on the left workup revealed grade 1 spondylolisthesis with instability and spinal stenosis.  Due to the patient's progression of clinical syndrome imaging findings failed conservative treatment I recommended decompression stabilization procedure at that level.  I extensively reviewed the risks and benefits of the operation with her as well as perioperative course expectations of outcome and alternatives to surgery and she understood and agreed to proceed forward.  Operative procedure: Patient was brought into the OR was induced under general anesthesia positioned prone the Wilson frame her back was prepped and draped in routine sterile fashion.  Preoperative pressure localizing appropriate level so after infiltration of 10 cc lidocaine with epi midline incision was made and Bovie electrocautery was used to take down the subcutaneous tissue and subperiosteal dissection was carried out lamina of L4 and L5 bilaterally.  Interoperative x-ray confirmed identification appropriate level so the facet joints were drilled down with a high-speed drill bilateral hemilaminectomies were performed extending up through the pars decompress the L4 nerve roots as well as aggressively under by the  superior tickling facet to decompress radically both L4 and L5.  And gain access to the lateral margin of the space.  This space was incised and epidural veins coagulated the space was cleaned out bilaterally endplates were prepared bilaterally and extensive my disc material was used sequential distraction with an 11 distractor in place I selected a 10 degree 22 mm 10 mm cage and packed with locally harvested autograft mixed with Vivigen and inserted packed and extensive mount of autograft material centrally and inserted contralateral cage this significantly reduce the deformity then under fluoroscopy I placed 4 cortical screws all screws with excellent purchase we then copiously irrigated wound meticulous hemostasis was maintained Gelfoam was ON top with her after react exploring the foramina to confirm patency I placed rods compressed L4 against L5 tightened everything down placed a medium Hemovac drain injected Exparel in the fascia.  I then closed the wound in layers with interrupted Vicryl and a running 4 subcuticular.  Dermabond benzoin Steri-Strips and a sterile dressing was applied patient to cover him in stable condition.  At the end the case all needle count sponge counts were correct.

## 2024-05-01 DIAGNOSIS — M4316 Spondylolisthesis, lumbar region: Secondary | ICD-10-CM | POA: Diagnosis not present

## 2024-05-01 MED ORDER — HYDROCODONE-ACETAMINOPHEN 5-325 MG PO TABS: 2.0000 | ORAL_TABLET | ORAL | 0 refills | Status: AC | PRN

## 2024-05-01 MED ORDER — CYCLOBENZAPRINE HCL 10 MG PO TABS: 10.0000 mg | ORAL_TABLET | Freq: Three times a day (TID) | ORAL | 0 refills | Status: AC | PRN

## 2024-05-01 NOTE — Evaluation (Signed)
 Occupational Therapy Evaluation Patient Details Name: Carolyn Rangel MRN: [id number] DOB: 06/15/1956 Today's Date: 05/01/2024   History of Present Illness   Carolyn Rangel is a 68 yo female who is s/p PLIF L4-5. PMHx: GERD, heart murmur, HLD, HTN, osteopenia     Clinical Impressions [name] was evaluated s/p the above spine surgery. She is indep and live with her husband who she assists with LB ADLs at baseline. Upon evaluation pt was limited by spinal precautions, back pain and limited activity tolerance. Overall she demonstrated mod I ability to complete ADLs, transfers and mobility with RW. Provided cues and education on spinal precautions and compensatory techniques throughout, handout provided and pt demonstrated great recall during ADLs and mobility. Pt does not require further acute OT services. Recommend d/c home with support of family.       If plan is discharge home, recommend the following:   A little help with bathing/dressing/bathroom;Assist for transportation;Assistance with cooking/housework     Functional Status Assessment   Patient has had a recent decline in their functional status and demonstrates the ability to make significant improvements in function in a reasonable and predictable amount of time.     Equipment Recommendations   None recommended by OT      Precautions/Restrictions   Precautions Precautions: Fall;Back Precaution Booklet Issued: Yes (comment) Recall of Precautions/Restrictions: Intact Required Braces or Orthoses: Spinal Brace Spinal Brace: Lumbar corset Restrictions Weight Bearing Restrictions Per Provider Order: No     Mobility Bed Mobility Overal bed mobility: Needs Assistance Bed Mobility: Rolling, Sidelying to Sit, Sit to Sidelying Rolling: Modified independent (Device/Increase time) Sidelying to sit: Modified independent (Device/Increase time)     Sit to sidelying: Modified independent (Device/Increase time)       Transfers Overall transfer level: Modified independent Equipment used: Rolling walker (2 wheels)            Balance Overall balance assessment: Needs assistance Sitting-balance support: Feet supported Sitting balance-Leahy Scale: Good     Standing balance support: No upper extremity supported, During functional activity Standing balance-Leahy Scale: Fair Standing balance comment: statically           ADL either performed or assessed with clinical judgement   ADL Overall ADL's : Modified independent           General ADL Comments: mod I for ADLs after review of spinal precuations     Vision Baseline Vision/History: 1 Wears glasses Vision Assessment?: No apparent visual deficits     Perception Perception: Within Functional Limits       Praxis Praxis: WFL       Pertinent Vitals/Pain Pain Assessment Pain Assessment: Faces Faces Pain Scale: Hurts little more Pain Location: back Pain Descriptors / Indicators: Discomfort Pain Intervention(s): Limited activity within patient's tolerance, Monitored during session     Extremity/Trunk Assessment Upper Extremity Assessment Upper Extremity Assessment: Overall WFL for tasks assessed   Lower Extremity Assessment Lower Extremity Assessment: Overall WFL for tasks assessed   Cervical / Trunk Assessment Cervical / Trunk Assessment: Back Surgery   Communication Communication Communication: No apparent difficulties   Cognition Arousal: Alert Behavior During Therapy: WFL for tasks assessed/performed Cognition: No apparent impairments           Following commands: Intact       Cueing  General Comments      VSS           Home Living Family/patient expects to be discharged to:: Private residence Living Arrangements: Spouse/significant other Available Help  at Discharge: Family;Available 24 hours/day Type of Home: House Home Access: Stairs to enter     Home Layout: Two level;Able to live on  main level with bedroom/bathroom     Bathroom Shower/Tub: Arts development officer Toilet: Handicapped height     Home Equipment: Agricultural consultant (2 wheels);Cane - single point;Tub bench          Prior Functioning/Environment Prior Level of Function : Independent/Modified Independent;Driving       Mobility Comments: no AD ADLs Comments: indep, assists her husband    OT Problem List: Decreased range of motion;Impaired balance (sitting and/or standing);Decreased activity tolerance        OT Goals(Current goals can be found in the care plan section)   Acute Rehab OT Goals Patient Stated Goal: home OT Goal Formulation: With patient Time For Goal Achievement: 05/15/24 Potential to Achieve Goals: Good   AM-PAC OT 6 Clicks Daily Activity     Outcome Measure Help from another person eating meals?: None Help from another person taking care of personal grooming?: None Help from another person toileting, which includes using toliet, bedpan, or urinal?: None Help from another person bathing (including washing, rinsing, drying)?: None Help from another person to put on and taking off regular upper body clothing?: None Help from another person to put on and taking off regular lower body clothing?: None 6 Click Score: 24   End of Session Equipment Utilized During Treatment: Rolling walker (2 wheels);Back brace Nurse Communication: Mobility status  Activity Tolerance: Patient tolerated treatment well Patient left: in bed;with call bell/phone within reach;with family/visitor present  OT Visit Diagnosis: Unsteadiness on feet (R26.81);Other abnormalities of gait and mobility (R26.89);Muscle weakness (generalized) (M62.81)                Time: 0802-0822 OT Time Calculation (min): 20 min Charges:  OT General Charges $OT Visit: 1 Visit OT Evaluation $OT Eval Low Complexity: 1 Low  Veryl Gottron, OTR/L Acute Rehabilitation Services Office [phone] Secure Chat  Communication Preferred   Starla Easterly 05/01/2024, 8:55 AM

## 2024-05-01 NOTE — Plan of Care (Signed)

## 2024-05-01 NOTE — Discharge Instructions (Signed)
 Wound Care Keep incision covered and dry until post op day 3. You may remove the Honeycomb dressing on post op day 3. Leave steri-strips on back.  They will fall off by themselves. Do not put any creams, lotions, or ointments on incision. You are fine to shower. Let water run over incision and pat dry.   Activity Walk each and every day, increasing distance each day. No lifting greater than 8 lbs.  No lifting no bending no twisting no driving or riding a car unless coming back and forth to see the doctor. If provided with back brace, wear when out of bed.  It is not necessary to wear brace in bed.  Diet Resume your normal diet.   Return to Work Will be discussed at your follow up appointment.  Call Your Doctor If Any of These Occur Redness, drainage, or swelling at the wound.  Temperature greater than 101 degrees. Severe pain not relieved by pain medication. Incision starts to come apart.  Follow Up Appt Call [phone] if you have one or any problem.

## 2024-05-01 NOTE — Progress Notes (Signed)
 Patient alert and oriented, mae's well, voiding adequate amount of urine, swallowing without difficulty, no c/o pain at time of discharge. Patient discharged home with family. Script and discharged instructions given to patient. Patient and family stated understanding of instructions given. Patient has an appointment with Dr. Wynetta Emery

## 2024-05-01 NOTE — Discharge Summary (Signed)
  Physician Discharge Summary  Patient ID: KADYNCE BONDS MRN: [id number] DOB/AGE: December 27, 1955 68 y.o. Estimated body mass index is 28.29 kg/m as calculated from the following:   Height as of this encounter: 5' 6 (1.676 m).   Weight as of this encounter: 79.5 kg.   Admit date: 04/30/2024 Discharge date: 05/01/2024  Admission Diagnoses: Grade 1 spondylolisthesis L4-5  Discharge Diagnoses: Same Principal Problem:   Spondylolisthesis at L4-L5 level   Discharged Condition: good  Hospital Course: Patient was admitted to hospital underwent decompressive laminectomy interbody fusion L4-5.  Postoperatively patient did very well covering the floor on the floor was ambulating and voiding spontaneously tolerating regular diet and stable for discharge home.  Patient will be discharged scheduled follow-up 1 to 2 weeks.  Consults: Significant Diagnostic Studies: Treatments: Posterior lumbar interbody fusion L4-5 Discharge Exam: Blood pressure 115/70, pulse (!) 59, temperature 98.3 F (36.8 C), temperature source Oral, resp. rate 18, height 5' 6 (1.676 m), weight 79.5 kg, SpO2 95%. Strength 5 out of 5 wound clean dry and intact  Disposition: Home   Allergies as of 05/01/2024       Reactions   Sulfa Antibiotics Anaphylaxis   Penicillins Hives, Itching   Simvastatin Other (See Comments)   Other reaction(s): Other (See Comments) Elevated LFT's Other Reaction(s): ELEVATED LFTs   Fluoxetine    Other reaction(s): Other (See Comments) feel strange Other Reaction(s): groggy/dizzy   Oxycodone Other (See Comments)   Makes her Jittery   Penicillin G    Other Reaction(s): can tolerate cephalosporins   Pravastatin    Other reaction(s): Other (See Comments) joint pain Other Reaction(s): myalgias   Sulfamethoxazole-trimethoprim Swelling   Other reaction(s): Other (See Comments) itching        Medication List     TAKE these medications    COQ10 PO Take 300 mg by mouth daily.    cyclobenzaprine 10 MG tablet Commonly known as: FLEXERIL Take 1 tablet (10 mg total) by mouth 3 (three) times daily as needed for muscle spasms.   HYDROcodone-acetaminophen 5-325 MG tablet Commonly known as: NORCO/VICODIN Take 2 tablets by mouth every 4 (four) hours as needed for severe pain (pain score 7-10).   ibuprofen 200 MG tablet Commonly known as: ADVIL Take 200 mg by mouth every 6 (six) hours as needed for mild pain.   losartan 100 MG tablet Commonly known as: COZAAR Take 50 mg by mouth daily.   metoprolol succinate 100 MG 24 hr tablet Commonly known as: TOPROL-XL Take 100 mg by mouth daily.   ondansetron  4 MG tablet Commonly known as: ZOFRAN  Take 1 tablet (4 mg total) by mouth every 6 (six) hours.   pantoprazole 40 MG tablet Commonly known as: PROTONIX Take 40 mg by mouth daily.   Repatha SureClick 140 MG/ML Soaj Generic drug: Evolocumab Inject 140 mg into the skin every 14 (fourteen) days.   Vitamin D3 50 MCG (2000 UT) capsule Take 2,000 Units by mouth daily.         Signed: Ferris Hua 05/01/2024, 7:46 AM

## 2024-09-17 ENCOUNTER — Other Ambulatory Visit: Payer: Self-pay | Admitting: Internal Medicine

## 2024-09-17 DIAGNOSIS — Z1231 Encounter for screening mammogram for malignant neoplasm of breast: Secondary | ICD-10-CM

## 2024-10-13 ENCOUNTER — Other Ambulatory Visit: Payer: Self-pay | Admitting: Neurological Surgery

## 2024-10-13 DIAGNOSIS — M4316 Spondylolisthesis, lumbar region: Secondary | ICD-10-CM

## 2024-10-20 ENCOUNTER — Inpatient Hospital Stay: Admission: RE | Admit: 2024-10-20

## 2024-10-21 ENCOUNTER — Other Ambulatory Visit: Payer: Self-pay

## 2024-10-21 ENCOUNTER — Observation Stay (HOSPITAL_BASED_OUTPATIENT_CLINIC_OR_DEPARTMENT_OTHER)
Admission: EM | Admit: 2024-10-21 | Discharge: 2024-10-25 | DRG: 392 | Disposition: A | Attending: Family Medicine | Admitting: Family Medicine

## 2024-10-21 ENCOUNTER — Encounter (HOSPITAL_BASED_OUTPATIENT_CLINIC_OR_DEPARTMENT_OTHER): Payer: Self-pay | Admitting: Emergency Medicine

## 2024-10-21 ENCOUNTER — Emergency Department (HOSPITAL_BASED_OUTPATIENT_CLINIC_OR_DEPARTMENT_OTHER)

## 2024-10-21 DIAGNOSIS — G4700 Insomnia, unspecified: Secondary | ICD-10-CM | POA: Diagnosis not present

## 2024-10-21 DIAGNOSIS — A084 Viral intestinal infection, unspecified: Principal | ICD-10-CM

## 2024-10-21 DIAGNOSIS — R112 Nausea with vomiting, unspecified: Secondary | ICD-10-CM | POA: Diagnosis present

## 2024-10-21 DIAGNOSIS — I10 Essential (primary) hypertension: Secondary | ICD-10-CM

## 2024-10-21 DIAGNOSIS — K219 Gastro-esophageal reflux disease without esophagitis: Secondary | ICD-10-CM | POA: Diagnosis not present

## 2024-10-21 DIAGNOSIS — R197 Diarrhea, unspecified: Secondary | ICD-10-CM

## 2024-10-21 DIAGNOSIS — K529 Noninfective gastroenteritis and colitis, unspecified: Principal | ICD-10-CM | POA: Insufficient documentation

## 2024-10-21 LAB — CBC WITH DIFFERENTIAL/PLATELET
Abs Immature Granulocytes: 0.02 K/uL (ref 0.00–0.07)
Basophils Absolute: 0 K/uL (ref 0.0–0.1)
Basophils Relative: 1 %
Eosinophils Absolute: 0.2 K/uL (ref 0.0–0.5)
Eosinophils Relative: 3 %
HCT: 39.6 % (ref 36.0–46.0)
Hemoglobin: 13.1 g/dL (ref 12.0–15.0)
Immature Granulocytes: 0 %
Lymphocytes Relative: 14 %
Lymphs Abs: 0.9 K/uL (ref 0.7–4.0)
MCH: 32.4 pg (ref 26.0–34.0)
MCHC: 33.1 g/dL (ref 30.0–36.0)
MCV: 98 fL (ref 80.0–100.0)
Monocytes Absolute: 0.6 K/uL (ref 0.1–1.0)
Monocytes Relative: 9 %
Neutro Abs: 4.5 K/uL (ref 1.7–7.7)
Neutrophils Relative %: 73 %
Platelets: 266 K/uL (ref 150–400)
RBC: 4.04 MIL/uL (ref 3.87–5.11)
RDW: 13.9 % (ref 11.5–15.5)
WBC: 6.2 K/uL (ref 4.0–10.5)
nRBC: 0 % (ref 0.0–0.2)

## 2024-10-21 LAB — COMPREHENSIVE METABOLIC PANEL WITH GFR
ALT: 24 U/L (ref 0–44)
AST: 27 U/L (ref 15–41)
Albumin: 4.2 g/dL (ref 3.5–5.0)
Alkaline Phosphatase: 110 U/L (ref 38–126)
Anion gap: 14 (ref 5–15)
BUN: 10 mg/dL (ref 8–23)
CO2: 22 mmol/L (ref 22–32)
Calcium: 9.6 mg/dL (ref 8.9–10.3)
Chloride: 106 mmol/L (ref 98–111)
Creatinine, Ser: 1.15 mg/dL — ABNORMAL HIGH (ref 0.44–1.00)
GFR, Estimated: 52 mL/min — ABNORMAL LOW (ref >60)
Glucose, Bld: 104 mg/dL — ABNORMAL HIGH (ref 70–99)
Potassium: 3.8 mmol/L (ref 3.5–5.1)
Sodium: 142 mmol/L (ref 135–145)
Total Bilirubin: 0.7 mg/dL (ref 0.0–1.2)
Total Protein: 6.3 g/dL — ABNORMAL LOW (ref 6.5–8.1)

## 2024-10-21 LAB — RESP PANEL BY RT-PCR (RSV, FLU A&B, COVID)  RVPGX2
Influenza A by PCR: NEGATIVE
Influenza B by PCR: NEGATIVE
Resp Syncytial Virus by PCR: NEGATIVE
SARS Coronavirus 2 by RT PCR: NEGATIVE

## 2024-10-21 LAB — LIPASE, BLOOD: Lipase: 56 U/L — ABNORMAL HIGH (ref 11–51)

## 2024-10-21 MED ORDER — HYDROMORPHONE HCL 1 MG/ML IJ SOLN
1.0000 mg | Freq: Once | INTRAMUSCULAR | Status: AC
  Administered 2024-10-21: 1 mg via INTRAVENOUS
  Filled 2024-10-21: qty 1

## 2024-10-21 MED ORDER — ONDANSETRON HCL 4 MG/2ML IJ SOLN
4.0000 mg | Freq: Four times a day (QID) | INTRAMUSCULAR | Status: DC | PRN
  Administered 2024-10-21: 4 mg via INTRAVENOUS
  Filled 2024-10-21 (×2): qty 2

## 2024-10-21 MED ORDER — ONDANSETRON HCL 4 MG/2ML IJ SOLN
4.0000 mg | Freq: Once | INTRAMUSCULAR | Status: AC
  Administered 2024-10-21: 4 mg via INTRAVENOUS
  Filled 2024-10-21: qty 2

## 2024-10-21 MED ORDER — SODIUM CHLORIDE 0.9 % IV SOLN
12.5000 mg | Freq: Four times a day (QID) | INTRAVENOUS | Status: DC | PRN
  Administered 2024-10-21: 12.5 mg via INTRAVENOUS
  Filled 2024-10-21: qty 12.5

## 2024-10-21 MED ORDER — ONDANSETRON HCL 4 MG PO TABS: 4.0000 mg | ORAL_TABLET | Freq: Four times a day (QID) | ORAL | Status: DC | PRN

## 2024-10-21 MED ORDER — LOSARTAN POTASSIUM 50 MG PO TABS
50.0000 mg | ORAL_TABLET | Freq: Every day | ORAL | Status: DC
  Administered 2024-10-22: 50 mg via ORAL
  Filled 2024-10-21: qty 1

## 2024-10-21 MED ORDER — AMLODIPINE BESYLATE 5 MG PO TABS
2.5000 mg | ORAL_TABLET | Freq: Every evening | ORAL | Status: DC
  Administered 2024-10-21 – 2024-10-22 (×2): 2.5 mg via ORAL
  Filled 2024-10-21: qty 1
  Filled 2024-10-21: qty 0.5

## 2024-10-21 MED ORDER — PANTOPRAZOLE SODIUM 40 MG PO TBEC
40.0000 mg | DELAYED_RELEASE_TABLET | Freq: Every day | ORAL | Status: DC
  Administered 2024-10-22 – 2024-10-25 (×4): 40 mg via ORAL
  Filled 2024-10-21 (×5): qty 1

## 2024-10-21 MED ORDER — ENOXAPARIN SODIUM 40 MG/0.4ML IJ SOSY
40.0000 mg | PREFILLED_SYRINGE | Freq: Every day | INTRAMUSCULAR | Status: DC
  Administered 2024-10-21: 40 mg via SUBCUTANEOUS
  Filled 2024-10-21: qty 0.4

## 2024-10-21 MED ORDER — FENTANYL CITRATE (PF) 50 MCG/ML IJ SOSY
50.0000 ug | PREFILLED_SYRINGE | Freq: Once | INTRAMUSCULAR | Status: AC
  Administered 2024-10-21: 50 ug via INTRAVENOUS
  Filled 2024-10-21: qty 1

## 2024-10-21 MED ORDER — TEMAZEPAM 15 MG PO CAPS
15.0000 mg | ORAL_CAPSULE | Freq: Every evening | ORAL | Status: DC | PRN
  Filled 2024-10-21: qty 1
  Filled 2024-10-21: qty 2

## 2024-10-21 MED ORDER — MORPHINE SULFATE (PF) 4 MG/ML IV SOLN
4.0000 mg | INTRAVENOUS | Status: AC | PRN
  Administered 2024-10-21: 4 mg via INTRAVENOUS
  Filled 2024-10-21: qty 1

## 2024-10-21 MED ORDER — ACETAMINOPHEN 650 MG RE SUPP: 650.0000 mg | Freq: Four times a day (QID) | RECTAL | Status: DC | PRN

## 2024-10-21 MED ORDER — IOHEXOL 300 MG/ML  SOLN
100.0000 mL | Freq: Once | INTRAMUSCULAR | Status: AC | PRN
  Administered 2024-10-21: 100 mL via INTRAVENOUS

## 2024-10-21 MED ORDER — HYDRALAZINE HCL 20 MG/ML IJ SOLN: 5.0000 mg | Freq: Four times a day (QID) | INTRAMUSCULAR | Status: DC | PRN

## 2024-10-21 MED ORDER — LACTATED RINGERS IV SOLN: INTRAVENOUS | Status: AC

## 2024-10-21 MED ORDER — HYDROMORPHONE HCL 1 MG/ML IJ SOLN
0.5000 mg | INTRAMUSCULAR | Status: AC | PRN
  Administered 2024-10-21 – 2024-10-22 (×3): 0.5 mg via INTRAVENOUS
  Filled 2024-10-21 (×3): qty 0.5

## 2024-10-21 MED ORDER — FENTANYL CITRATE (PF) 50 MCG/ML IJ SOSY
25.0000 ug | PREFILLED_SYRINGE | INTRAMUSCULAR | Status: AC | PRN
  Filled 2024-10-21 (×2): qty 1

## 2024-10-21 MED ORDER — ACETAMINOPHEN 325 MG PO TABS: 650.0000 mg | ORAL_TABLET | Freq: Four times a day (QID) | ORAL | Status: DC | PRN

## 2024-10-21 MED ORDER — METOPROLOL SUCCINATE ER 50 MG PO TB24
100.0000 mg | ORAL_TABLET | Freq: Every evening | ORAL | Status: DC
  Administered 2024-10-21 – 2024-10-24 (×4): 100 mg via ORAL
  Filled 2024-10-21 (×2): qty 1
  Filled 2024-10-21 (×2): qty 2
  Filled 2024-10-21: qty 1

## 2024-10-21 NOTE — Progress Notes (Signed)
 Per PT, she does not feel she has a need for a Flutter device- here for pain, nausea and vomiting. Order has been discontinued by RT.

## 2024-10-21 NOTE — Assessment & Plan Note (Signed)
 Home amlodipine  2.5 mg every evening, losartan  50 mg daily, metoprolol  100 mg every evening resumed on admission

## 2024-10-21 NOTE — ED Triage Notes (Signed)
 Pt caox4 ambulatory c/o generalized abd pain with N/V/D since Sunday, pain worsened this morning. Afebrile. Further reports she has had lower abd pain x4-5 wks. PMH cholecystectomy.

## 2024-10-21 NOTE — H&P (Addendum)
 History and Physical- Telemedicine  ALLECIA BELLS FMW:[id number] DOB: [birth year] DOA: 10/21/2024  PCP: Larnell Hamilton, MD  Patient coming from: home  Referring provider: Dr. Patsey Telemedicine provider: Dr. Sherre Patient location: [facility] health emergency department at [facility] Referring diagnosis: Enteritis intractable nausea Patient name and DOB verified: Patient was able to verify her first and last name: Carolyn Rangel, and her date of birth:24-Feb-1956. Patient consented to Telemedicine Evaluation: Yes RN virtual assistant: Alan Flora, RN Video encounter time and date: 10/21/24 at approximately: 10:59h  Chief Concern: Abdominal pain, persistent nausea  HPI: Ms. Sanna Porcaro is a 68 year old female with history of hepatic steatosis, history of cholecystectomy, hypertension, GERD, insomnia.  10/21/2024: Patient presents to the ED for chief concerns of of abdominal pain, nausea, vomiting, diarrhea.  Vitals at the time of my evaluation showed t of 98.3, rr 20, hr 70, blood pressure 141/79, SpO2 97% on room air.  Serum sodium is 142, potassium 3.8, chloride 106, bicarb 22, BUN of 10, serum creatinine 1.15, eGFR 52, nonfasting blood glucose 104, WBC 6.2, hemoglobin 13.1, platelets of 266.  Lipase was 56.  COVID/flu A/influenza B/RSV PCR were negative.  ED treatment: Fentanyl  50 mcg IV one-time dose, Dilaudid  1 mg IV one-time dose, ondansetron  4 mg IV one-time dose, LR infusion at 125 mL/h.  10/21/2024: Patient admitted to [facility] for chief concerns of bone pain, intractable nausea and vomiting and diarrhea. ------------------------------------- At bedside, patient was able to tell me her first last name, date of birth, current calendar year.  Patient reports that she has been having lower abdominal discomfort for about 4 to 5 weeks.  Of note, patient had brunch at a restaurant on Sunday and she only ate half of French toast  She reports she did  not eat anything else at besides the French toast.  Patient reports that her husband did not get sick.  Patient reports she had 8 episodes of diarrhea on Monday, yesterday.  She denies known sick contacts.  She endorses persistent nausea however denies vomiting.  She denies chest pain, shortness of breath, fever.  She reports her stool has been orange or brown in color.  Initially the stool yesterday started as loose and then became watery.  Social history: Patient lives at home with her husband.  She denies tobacco, recreational drug use.  She states she has about 6 ounces of wine per night.  She denies history of tremors or withdrawals when she does not drink or wine.  She is retired.  ROS: Constitutional: no weight change, no fever ENT/Mouth: no sore throat, no rhinorrhea Eyes: no eye pain, no vision changes Cardiovascular: no chest pain, no dyspnea,  no edema, no palpitations Respiratory: no cough, no sputum, no wheezing Gastrointestinal: + nausea, no vomiting, + diarrhea, no constipation Genitourinary: no urinary incontinence, no dysuria, no hematuria Musculoskeletal: no arthralgias, no myalgias Skin: no skin lesions, no pruritus, Neuro: + weakness, no loss of consciousness, no syncope Psych: no anxiety, no depression, + decrease appetite Heme/Lymph: no bruising, no bleeding  ED Course: Discussed with EDP, patient requiring hospitalization for chief concerns of intractable nausea and vomiting.  Assessment/Plan  Principal Problem:   Intractable nausea and vomiting Active Problems:   Diarrhea   Essential hypertension   Insomnia   GERD (gastroesophageal reflux disease)   Enteritis   Assessment and Plan:  * Intractable nausea and vomiting With abdominal pain Secondary to enteritis Symptomatic support Continue with LR infusion 125 mL/h, 1 day ordered Morphine  4  mg IV every 4 hours as needed for moderate pain, 1 day ordered Fentanyl  25 mcg IV every 3 hours as needed for  severe pain, 1 day ordered Dilaudid  0.5 mg IV every 4 hours as needed for severe pain, not relieved by fentanyl  or IV morphine , 3 doses ordered Will need telemetry  Diarrhea Patient had 8 episodes of diarrhea yesterday and loose stool today C. difficile PCR screening and GI panel No antidiarrheal medication will be given until the above panel is negative for infectious diarrhea  Enteritis I suspect viral enteritis  GERD (gastroesophageal reflux disease) Protonix  40 mg daily resumed  Insomnia Temazepam  15 mg nightly as needed for sleep resumed  Essential hypertension Home amlodipine  2.5 mg every evening, losartan  50 mg daily, metoprolol  100 mg every evening resumed on admission  Chart reviewed.   DVT prophylaxis: Enoxaparin  Code Status: Full code Diet: Clear liquid diet with orders to advance as tolerated to heart healthy diet Family Communication: A phone call was offered, patient declined Disposition Plan: Pending clinical course Consults called: None at this time Admission status: Telemetry, observation  Past Medical History:  Diagnosis Date   Cataract    bilateral sx   GERD (gastroesophageal reflux disease)    on meds   Heart murmur    as a child   Hyperlipidemia    on meds   Hypertension    on meds   Osteopenia    Seasonal allergies    Past Surgical History:  Procedure Laterality Date   CATARACT EXTRACTION, BILATERAL  2014   2016   CHOLECYSTECTOMY  2012   TONSILLECTOMY     VAGINAL HYSTERECTOMY  2002   Social History:  reports that she has never smoked. She has never used smokeless tobacco. She reports current alcohol use. She reports that she does not use drugs.  Allergies  Allergen Reactions   Sulfa Antibiotics Anaphylaxis   Penicillins Hives and Itching   Simvastatin Other (See Comments)    Other reaction(s): Other (See Comments)  Elevated LFT's  Other Reaction(s): ELEVATED LFTs   Fluoxetine     Other reaction(s): Other (See Comments) feel  strange  Other Reaction(s): groggy/dizzy   Oxycodone  Other (See Comments)    Makes her Jittery   Penicillin G     Other Reaction(s): can tolerate cephalosporins   Pravastatin     Other reaction(s): Other (See Comments) joint pain  Other Reaction(s): myalgias   Sulfamethoxazole-Trimethoprim Swelling    Other reaction(s): Other (See Comments) itching   Family History  Problem Relation Age of Onset   Breast cancer Mother 64   Colon polyps Mother 40   Bone cancer Mother 35       mets from breast   Alcoholism Father    Liver disease Father    Heart attack Father 107   Breast cancer Sister 100   Colon cancer Neg Hx    Esophageal cancer Neg Hx    Stomach cancer Neg Hx    Rectal cancer Neg Hx    Family history: Family history reviewed and not pertinent.  Prior to Admission medications   Medication Sig Start Date End Date Taking? Authorizing Provider  meloxicam (MOBIC) 7.5 MG tablet Take 7.5 mg by mouth daily as needed. 08/06/24  Yes [provider]  temazepam  (RESTORIL ) 15 MG capsule Take 15 mg by mouth at bedtime as needed. 09/03/24  Yes [provider]  amLODipine  (NORVASC ) 2.5 MG tablet Take 2.5 mg by mouth every evening.    [provider]  Cholecalciferol (VITAMIN D3) 50 MCG (2000 UT) capsule Take 2,000 Units by mouth daily.    [provider]  Coenzyme Q10 (COQ10 PO) Take 300 mg by mouth daily.    [provider]  cyclobenzaprine  (FLEXERIL ) 10 MG tablet Take 1 tablet (10 mg total) by mouth 3 (three) times daily as needed for muscle spasms. 05/01/24   Onetha Kuba, MD  HYDROcodone -acetaminophen  (NORCO/VICODIN) 5-325 MG tablet Take 2 tablets by mouth every 4 (four) hours as needed for severe pain (pain score 7-10). 05/01/24   Onetha Kuba, MD  ibuprofen (ADVIL) 200 MG tablet Take 200 mg by mouth every 6 (six) hours as needed for mild pain.    [provider]  losartan  (COZAAR ) 100 MG tablet Take 50 mg by mouth daily. 11/10/21    [provider]  metoprolol  succinate (TOPROL -XL) 100 MG 24 hr tablet Take 100 mg by mouth daily. 03/26/17   [provider]  ondansetron  (ZOFRAN ) 4 MG tablet Take 1 tablet (4 mg total) by mouth every 6 (six) hours. Patient not taking: Reported on 04/15/2024 03/03/23   Roemhildt, Lorin T, PA-C  pantoprazole  (PROTONIX ) 40 MG tablet Take 40 mg by mouth daily. 09/23/21   [provider]  REPATHA  SURECLICK 140 MG/ML SOAJ Inject 140 mg into the skin every 14 (fourteen) days.    [provider]   Physical Exam: Vitals:   10/21/24 0805 10/21/24 0806 10/21/24 1255 10/21/24 1449  BP:  (!) 142/82 133/69   Pulse:  73 66 64  Resp: 20  18 16   Temp: 98.3 F (36.8 C)  98 F (36.7 C)   TempSrc: Oral     SpO2:  97% 95% 95%  Weight: 77.1 kg     Height: 5' 6 (1.676 m)      Physical Exam was completed with assistance of Alan Flora, RN who was present at bedside during this portion of the virtual encounter.  Constitutional: appears age-appropriate, mildly uncomfortable, calm Eyes: EOMI, conjunctivae normal ENMT: Mucous membranes are dry. Hearing appropriate Neck: normal in midline Respiratory: clear to auscultation bilaterally, no wheezing. Normal respiratory effort. No accessory muscle use.  Cardiovascular: Normal S1, normal S2, regular rate and rhythm, no murmurs..  Abdomen: + Right sided tenderness with some distention. Bowel sounds hyperactive.  Skin: no rashes, ulcers on visible skin. Neurologic: Sensation intact. Strength is appropriate.  Psychiatric: Normal judgment and insight. Alert and oriented x 3. Normal mood.   EKG: None indicated at this time  Chest x-ray on Admission: None indicated at this time  CT L-SPINE NO CHARGE Result Date: 10/21/2024 EXAM: CT OF THE LUMBAR SPINE WITHOUT CONTRAST 10/21/2024 09:35:11 AM TECHNIQUE: CT of the lumbar spine was performed without the administration of intravenous contrast. Multiplanar reformatted images are  provided for review. Automated exposure control, iterative reconstruction, and/or weight based adjustment of the mA/kV was utilized to reduce the radiation dose to as low as reasonably achievable. COMPARISON: None available. CLINICAL HISTORY: FINDINGS: BONES AND ALIGNMENT: Normal vertebral body heights. No acute fracture or suspicious bone lesion. Status post interbody fusion and bilateral posterolateral spinal fixation at L4-L5. There is slight anterolisthesis at the operative level. DEGENERATIVE CHANGES: The spinal canal and neural foramina appear patent. SOFT TISSUES: No acute abnormality. IMPRESSION: 1. Status post interbody fusion and bilateral posterolateral spinal fixation at L4-5 with slight anterolisthesis at the operative level. The spinal canal and neural foramina appear patent. Electronically signed by: Evalene Coho MD 10/21/2024 09:56 AM EST RP Workstation: [id number]   CT ABDOMEN  PELVIS W CONTRAST Result Date: 10/21/2024 EXAM: CT ABDOMEN AND PELVIS WITH CONTRAST 10/21/2024 09:35:11 AM TECHNIQUE: CT of the abdomen and pelvis was performed with the administration of 100 mL of iohexol  (OMNIPAQUE ) 300 MG/ML solution. Multiplanar reformatted images are provided for review. Automated exposure control, iterative reconstruction, and/or weight-based adjustment of the mA/kV was utilized to reduce the radiation dose to as low as reasonably achievable. COMPARISON: CT of the abdomen and pelvis dated 03/03/2023. CLINICAL HISTORY: Bowel obstruction suspected. FINDINGS: LOWER CHEST: There is streaky atelectasis/scarring present dependently within the lower lobes bilaterally. LIVER: There is diffuse fatty infiltration of the liver. GALLBLADDER AND BILE DUCTS: The patient is status post cholecystectomy. No biliary ductal dilatation. SPLEEN: No acute abnormality. PANCREAS: No acute abnormality. ADRENAL GLANDS: No acute abnormality. KIDNEYS, URETERS AND BLADDER: No stones in the kidneys or ureters. No  hydronephrosis. No perinephric or periureteral stranding. Urinary bladder is unremarkable. GI AND BOWEL: Stomach demonstrates no acute abnormality. There is abnormal thickening of the wall of several loops of the mid to distal small bowel and there is stranding of the adjacent fat. The terminal ileum is spared. The proximal small bowel is nondilated. There is no bowel obstruction. PERITONEUM AND RETROPERITONEUM: No ascites. No free air. VASCULATURE: Aorta is normal in caliber. LYMPH NODES: There are a few shotty mesenteric lymph nodes. REPRODUCTIVE ORGANS: The patient is status post hysterectomy and bilateral salpingo-oophorectomy. BONES AND SOFT TISSUES: The patient is also status post interbody fusion and bilateral posterolateral spinal fixation at L4-L5 and left total hip arthroplasty. No acute osseous abnormality. No focal soft tissue abnormality. IMPRESSION: 1. Abnormal thickening of the wall of several loops of the mid to distal small bowel with adjacent fat stranding, concerning for enteritis. The terminal ileum is spared. 2. Nondilated proximal small bowel. No evidence of bowel obstruction. 3. Diffuse hepatic steatosis. Correlate with liver enzymes and metabolic risk factors. 4. Dependent subsegmental atelectasis/scar in the lower lobes. Electronically signed by: Evalene Coho MD 10/21/2024 09:55 AM EST RP Workstation: [id number]   Labs on Admission: I have personally reviewed following labs  CBC: Recent Labs  Lab 10/21/24 0827  WBC 6.2  NEUTROABS 4.5  HGB 13.1  HCT 39.6  MCV 98.0  PLT 266   Basic Metabolic Panel: Recent Labs  Lab 10/21/24 0827  NA 142  K 3.8  CL 106  CO2 22  GLUCOSE 104*  BUN 10  CREATININE 1.15*  CALCIUM 9.6   GFR: Estimated Creatinine Clearance: 49.1 mL/min (A) (by C-G formula based on SCr of 1.15 mg/dL (H)). Liver Function Tests: Recent Labs  Lab 10/21/24 0827  AST 27  ALT 24  ALKPHOS 110  BILITOT 0.7  PROT 6.3*  ALBUMIN  4.2   Recent Labs  Lab  10/21/24 0827  LIPASE 56*   Urine analysis:    Component Value Date/Time   COLORURINE YELLOW 03/03/2023 1746   APPEARANCEUR CLEAR 03/03/2023 1746   LABSPEC 1.009 03/03/2023 1746   PHURINE 5.5 03/03/2023 1746   GLUCOSEU NEGATIVE 03/03/2023 1746   HGBUR NEGATIVE 03/03/2023 1746   BILIRUBINUR NEGATIVE 03/03/2023 1746   KETONESUR TRACE (A) 03/03/2023 1746   PROTEINUR NEGATIVE 03/03/2023 1746   NITRITE NEGATIVE 03/03/2023 1746   LEUKOCYTESUR TRACE (A) 03/03/2023 1746   This document was prepared using Dragon Voice Recognition software and may include unintentional dictation errors.  Dr. Sherre [facility] Location: [place]  If 7PM-7AM, please contact overnight-coverage provider If 7AM-7PM, please contact day attending provider [email or web address]  10/21/2024, 2:57 PM

## 2024-10-21 NOTE — Assessment & Plan Note (Signed)
On Protonix 40 mg daily.

## 2024-10-21 NOTE — ED Provider Notes (Signed)
 [facility]ERGENCY DEPARTMENT AT [facility] Provider Note   CSN: [id number] Arrival date & time: 10/21/24  9243     Patient presents with: Abdominal Pain   Carolyn Rangel is a 68 y.o. female.    Abdominal Pain Patient had 2 to 3 days of abdominal pain.  Nausea and some diarrhea.  Began Sunday with today being Tuesday.  Worsened this morning.  Crampy pain that does come and go somewhat.  Mostly on the abdomen and abdomen feels more swollen.  Does have lower abdominal pain has been going for a while.  However she contributed that to her back issues.  States she is scheduled to have a lumbar spine CT to evaluate previous fixation.  Has had C-section and cholecystectomy.    Past Medical History:  Diagnosis Date   Cataract    bilateral sx   GERD (gastroesophageal reflux disease)    on meds   Heart murmur    as a child   Hyperlipidemia    on meds   Hypertension    on meds   Osteopenia    Seasonal allergies     Prior to Admission medications   Medication Sig Start Date End Date Taking? Authorizing Provider  Cholecalciferol (VITAMIN D3) 50 MCG (2000 UT) capsule Take 2,000 Units by mouth daily.    [provider]  Coenzyme Q10 (COQ10 PO) Take 300 mg by mouth daily.    [provider]  cyclobenzaprine  (FLEXERIL ) 10 MG tablet Take 1 tablet (10 mg total) by mouth 3 (three) times daily as needed for muscle spasms. 05/01/24   Onetha Kuba, MD  HYDROcodone -acetaminophen  (NORCO/VICODIN) 5-325 MG tablet Take 2 tablets by mouth every 4 (four) hours as needed for severe pain (pain score 7-10). 05/01/24   Onetha Kuba, MD  ibuprofen (ADVIL) 200 MG tablet Take 200 mg by mouth every 6 (six) hours as needed for mild pain.    [provider]  losartan  (COZAAR ) 100 MG tablet Take 50 mg by mouth daily. 11/10/21   [provider]  metoprolol  succinate (TOPROL -XL) 100 MG 24 hr tablet Take 100 mg by mouth daily. 03/26/17   [provider]   ondansetron  (ZOFRAN ) 4 MG tablet Take 1 tablet (4 mg total) by mouth every 6 (six) hours. Patient not taking: Reported on 04/15/2024 03/03/23   Roemhildt, Lorin T, PA-C  pantoprazole  (PROTONIX ) 40 MG tablet Take 40 mg by mouth daily. 09/23/21   [provider]  REPATHA  SURECLICK 140 MG/ML SOAJ Inject 140 mg into the skin every 14 (fourteen) days.    [provider]    Allergies: Sulfa antibiotics, Penicillins, Simvastatin, Fluoxetine, Oxycodone , Penicillin g, Pravastatin, and Sulfamethoxazole-trimethoprim    Review of Systems  Gastrointestinal:  Positive for abdominal pain.    Updated Vital Signs BP (!) 142/82   Pulse 73   Temp 98.3 F (36.8 C) (Oral)   Resp 20   Ht 5' 6 (1.676 m)   Wt 77.1 kg   SpO2 97%   BMI 27.44 kg/m   Physical Exam Vitals and nursing note reviewed.  Cardiovascular:     Rate and Rhythm: Normal rate.  Abdominal:     Tenderness: There is abdominal tenderness.     Comments: Diffuse abdominal tenderness with some potential distention.  No hernia palpated.  Tenderness particular on right abdomen.  Neurological:     Mental Status: She is alert.     (all labs ordered are listed, but only abnormal results are displayed) Labs Reviewed  RESP PANEL BY RT-PCR (RSV, FLU A&B, COVID)  [id number]  CBC WITH DIFFERENTIAL/PLATELET  COMPREHENSIVE METABOLIC PANEL WITH GFR  LIPASE, BLOOD    EKG: None  Radiology: No results found.   Procedures   Medications Ordered in the ED  ondansetron  (ZOFRAN ) injection 4 mg (4 mg Intravenous Given 10/21/24 0827)  fentaNYL  (SUBLIMAZE ) injection 50 mcg (50 mcg Intravenous Given 10/21/24 0828)                                    Medical Decision Making Amount and/or Complexity of Data Reviewed Labs: ordered. Radiology: ordered.  Risk Prescription drug management. Decision regarding hospitalization.   Patient abdominal pain.  Some distention.  Differential diagnosis include infections and obstructions  nonspecific abdominal pain.  Will get basic blood work and CT scan.  Also discussed with CT techs and will be able to get the CT scan of the lumbar spine also although will be interpreted by neurosurgery.  Blood work overall reassuring.  Creatinine mildly elevated but appears at baseline.  Lipase slightly above normal.  CBC reassuring.  However CT scan done and shows an enteritis.  Continued pain.  Uncontrolled with fentanyl  requiring Dilaudid .  With this continued pain I think patient benefit from admission to the hospital.  Will discuss with hospitalist.  Since she has had some diarrhea we will also add stool studies.     Final diagnoses:  None    ED Discharge Orders     None          Patsey Lot, MD 10/21/24 1013

## 2024-10-21 NOTE — Progress Notes (Signed)
 Patient has arrived to [facility] with no active issues except for the ones outlined in the H&P. Please see H&P for full details.

## 2024-10-21 NOTE — ED Notes (Signed)
 Called [facility] for transport 11:35-TC

## 2024-10-21 NOTE — Assessment & Plan Note (Signed)
 Temazepam  15 mg nightly as needed for sleep resumed

## 2024-10-21 NOTE — ED Notes (Signed)
 Assisted Dr Sherre with virtual hospitalist visit. Addressed patient's pain and medications administered. IV LR started at 125 mL/hr per pump. Provided with water and ginger ale. Pain currently 6/10. Call bell within reach.

## 2024-10-21 NOTE — Hospital Course (Signed)
 Ms. Carolyn Rangel is a 68 year old female with history of hepatic steatosis, history of cholecystectomy, hypertension, GERD, insomnia.  10/21/2024: Patient presents to the ED for chief concerns of of abdominal pain, nausea, vomiting, diarrhea.  Vitals at the time of my evaluation showed t of 98.3, rr 20, hr 70, blood pressure 141/79, SpO2 97% on room air.  Serum sodium is 142, potassium 3.8, chloride 106, bicarb 22, BUN of 10, serum creatinine 1.15, eGFR 52, nonfasting blood glucose 104, WBC 6.2, hemoglobin 13.1, platelets of 266.  Lipase was 56.  COVID/flu A/influenza B/RSV PCR were negative.  ED treatment: Fentanyl  50 mcg IV one-time dose, Dilaudid  1 mg IV one-time dose, ondansetron  4 mg IV one-time dose, LR infusion at 125 mL/h.  10/21/2024: Patient admitted to [facility] for chief concerns of bone pain, intractable nausea and vomiting and diarrhea.

## 2024-10-21 NOTE — Progress Notes (Signed)
   10/21/24 1604  TOC Brief Assessment  Insurance and Status Reviewed  Home environment has been reviewed single family home  Prior level of function: independent  Prior/Current Home Services No current home services  Social Drivers of Health Review SDOH reviewed no interventions necessary  Readmission risk has been reviewed Yes  Transition of care needs no transition of care needs at this time    Signed: Heather Saltness, MSW, LCSW Clinical Social Worker Inpatient Care Management 10/21/2024 4:04 PM

## 2024-10-21 NOTE — Assessment & Plan Note (Signed)
 Viral etiology is most likely.  Stool studies are pending.  Patient gradually improving but continues to have intermittent abdominal pain and diarrhea.  No fevers or chills. --Will continue empiric antibiotic coverage for now with IV Rocephin , p.o. Flagyl  --Discussed with patient advancing diet as tolerated --she is currently taking only liquids had tried full liquids in the hospital --Patient seems to be hydrating adequately, no need for further IV fluids at this time --Monitor fever curve, CBC and electrolytes --Pain control and antiemetics as needed

## 2024-10-21 NOTE — Assessment & Plan Note (Addendum)
 In setting of gastroenteritis.  Improving C. difficile and GI panel were both negative. 12/13 -patient had another loose BM this morning but diarrhea has improved significantly

## 2024-10-21 NOTE — Assessment & Plan Note (Addendum)
 In the setting of gastroenteritis.   12/12 this is mostly resolved, patient reporting minimal intermittent nausea 12/13 upset stomach overnight recurrent but seems likely from oral antibiotic.  Patient feels much better this morning -- Continue as needed Zofran  and start probiotics

## 2024-10-22 DIAGNOSIS — Z90710 Acquired absence of both cervix and uterus: Secondary | ICD-10-CM | POA: Diagnosis not present

## 2024-10-22 DIAGNOSIS — K760 Fatty (change of) liver, not elsewhere classified: Secondary | ICD-10-CM | POA: Diagnosis present

## 2024-10-22 DIAGNOSIS — Z6372 Alcoholism and drug addiction in family: Secondary | ICD-10-CM | POA: Diagnosis not present

## 2024-10-22 DIAGNOSIS — Z882 Allergy status to sulfonamides status: Secondary | ICD-10-CM | POA: Diagnosis not present

## 2024-10-22 DIAGNOSIS — Z791 Long term (current) use of non-steroidal anti-inflammatories (NSAID): Secondary | ICD-10-CM | POA: Diagnosis not present

## 2024-10-22 DIAGNOSIS — Z803 Family history of malignant neoplasm of breast: Secondary | ICD-10-CM | POA: Diagnosis not present

## 2024-10-22 DIAGNOSIS — Z83719 Family history of colon polyps, unspecified: Secondary | ICD-10-CM | POA: Diagnosis not present

## 2024-10-22 DIAGNOSIS — E785 Hyperlipidemia, unspecified: Secondary | ICD-10-CM | POA: Diagnosis present

## 2024-10-22 DIAGNOSIS — Z888 Allergy status to other drugs, medicaments and biological substances status: Secondary | ICD-10-CM | POA: Diagnosis not present

## 2024-10-22 DIAGNOSIS — M8580 Other specified disorders of bone density and structure, unspecified site: Secondary | ICD-10-CM | POA: Diagnosis present

## 2024-10-22 DIAGNOSIS — Z79899 Other long term (current) drug therapy: Secondary | ICD-10-CM | POA: Diagnosis not present

## 2024-10-22 DIAGNOSIS — Z1152 Encounter for screening for COVID-19: Secondary | ICD-10-CM | POA: Diagnosis not present

## 2024-10-22 DIAGNOSIS — R112 Nausea with vomiting, unspecified: Secondary | ICD-10-CM | POA: Diagnosis present

## 2024-10-22 DIAGNOSIS — A084 Viral intestinal infection, unspecified: Secondary | ICD-10-CM | POA: Diagnosis present

## 2024-10-22 DIAGNOSIS — Z811 Family history of alcohol abuse and dependence: Secondary | ICD-10-CM | POA: Diagnosis not present

## 2024-10-22 DIAGNOSIS — Z8249 Family history of ischemic heart disease and other diseases of the circulatory system: Secondary | ICD-10-CM | POA: Diagnosis not present

## 2024-10-22 DIAGNOSIS — I10 Essential (primary) hypertension: Secondary | ICD-10-CM | POA: Diagnosis present

## 2024-10-22 DIAGNOSIS — G4700 Insomnia, unspecified: Secondary | ICD-10-CM | POA: Diagnosis present

## 2024-10-22 DIAGNOSIS — K219 Gastro-esophageal reflux disease without esophagitis: Secondary | ICD-10-CM | POA: Diagnosis present

## 2024-10-22 LAB — CBC
HCT: 36.8 % (ref 36.0–46.0)
Hemoglobin: 11.3 g/dL — ABNORMAL LOW (ref 12.0–15.0)
MCH: 32.9 pg (ref 26.0–34.0)
MCHC: 30.7 g/dL (ref 30.0–36.0)
MCV: 107.3 fL — ABNORMAL HIGH (ref 80.0–100.0)
Platelets: 229 K/uL (ref 150–400)
RBC: 3.43 MIL/uL — ABNORMAL LOW (ref 3.87–5.11)
RDW: 13.8 % (ref 11.5–15.5)
WBC: 5 K/uL (ref 4.0–10.5)
nRBC: 0 % (ref 0.0–0.2)

## 2024-10-22 LAB — BASIC METABOLIC PANEL WITH GFR
Anion gap: 9 (ref 5–15)
BUN: 7 mg/dL — ABNORMAL LOW (ref 8–23)
CO2: 26 mmol/L (ref 22–32)
Calcium: 8.9 mg/dL (ref 8.9–10.3)
Chloride: 104 mmol/L (ref 98–111)
Creatinine, Ser: 0.99 mg/dL (ref 0.44–1.00)
GFR, Estimated: >60 mL/min (ref >60)
Glucose, Bld: 99 mg/dL (ref 70–99)
Potassium: 3.5 mmol/L (ref 3.5–5.1)
Sodium: 139 mmol/L (ref 135–145)

## 2024-10-22 MED ORDER — METRONIDAZOLE 500 MG PO TABS
500.0000 mg | ORAL_TABLET | Freq: Two times a day (BID) | ORAL | Status: DC
  Administered 2024-10-22 – 2024-10-25 (×7): 500 mg via ORAL
  Filled 2024-10-22 (×9): qty 1

## 2024-10-22 MED ORDER — IPRATROPIUM-ALBUTEROL 0.5-2.5 (3) MG/3ML IN SOLN
3.0000 mL | RESPIRATORY_TRACT | Status: DC | PRN
  Filled 2024-10-22 (×6): qty 3

## 2024-10-22 MED ORDER — HYDROCODONE-ACETAMINOPHEN 10-325 MG PO TABS: 1.0000 | ORAL_TABLET | Freq: Four times a day (QID) | ORAL | Status: DC | PRN

## 2024-10-22 MED ORDER — CIPROFLOXACIN HCL 500 MG PO TABS
500.0000 mg | ORAL_TABLET | Freq: Two times a day (BID) | ORAL | Status: DC
  Administered 2024-10-22: 500 mg via ORAL
  Filled 2024-10-22 (×3): qty 1

## 2024-10-22 MED ORDER — SODIUM CHLORIDE 0.9 % IV SOLN
2.0000 g | INTRAVENOUS | Status: DC
  Administered 2024-10-22 – 2024-10-23 (×2): 2 g via INTRAVENOUS
  Filled 2024-10-22 (×3): qty 20

## 2024-10-22 MED ORDER — HYDROCODONE-ACETAMINOPHEN 7.5-325 MG/15ML PO SOLN: 10.0000 mL | Freq: Four times a day (QID) | ORAL | Status: DC | PRN

## 2024-10-22 MED ORDER — AMLODIPINE BESYLATE 5 MG PO TABS
5.0000 mg | ORAL_TABLET | Freq: Every evening | ORAL | Status: DC
  Administered 2024-10-23: 2.5 mg via ORAL
  Administered 2024-10-24: 5 mg via ORAL
  Filled 2024-10-22 (×3): qty 1

## 2024-10-22 MED ORDER — HYDROMORPHONE HCL 1 MG/ML IJ SOLN
0.5000 mg | INTRAMUSCULAR | Status: DC | PRN
  Administered 2024-10-22: 0.5 mg via INTRAVENOUS
  Filled 2024-10-22: qty 0.5

## 2024-10-22 MED ORDER — HYDROCODONE-ACETAMINOPHEN 10-325 MG PO TABS
1.0000 | ORAL_TABLET | Freq: Three times a day (TID) | ORAL | Status: DC | PRN
  Administered 2024-10-23: 1 via ORAL
  Filled 2024-10-22 (×2): qty 1

## 2024-10-22 MED ORDER — METOPROLOL TARTRATE 5 MG/5ML IV SOLN: 5.0000 mg | INTRAVENOUS | Status: DC | PRN

## 2024-10-22 MED ORDER — GLUCAGON HCL RDNA (DIAGNOSTIC) 1 MG IJ SOLR: 1.0000 mg | INTRAMUSCULAR | Status: DC | PRN

## 2024-10-22 NOTE — Evaluation (Signed)
 Physical Therapy Evaluation-1x Patient Details Name: Carolyn Rangel MRN: [id number] DOB: Apr 20, 1956 Today's Date: 10/22/2024  History of Present Illness  68 yo female admitted with N/V/D, enteritis. Hx of lumbar fusion, THA, cholecystectomy, falls  Clinical Impression  On eval, pt was Mod Ind with mobility. She ambulated ~150 feet in the hallway without a device. She tolerated activity well. Reports mild abdominal discomfort, reports she has been able to eat a bit. Pt plans to return home where she lives with her husband. She politely declines any f/u PT at this time-reports she goes to [facility]. She reprots she has no DME needs. 1x eval.        If plan is discharge home, recommend the following:     Can travel by private vehicle        Equipment Recommendations None recommended by PT  Recommendations for Other Services       Functional Status Assessment Patient has not had a recent decline in their functional status     Precautions / Restrictions Precautions Precautions: Fall Restrictions Weight Bearing Restrictions Per Provider Order: No      Mobility  Bed Mobility               General bed mobility comments: sitting EOB fully dressed-reports she is waiting to be discharged    Transfers Overall transfer level: Modified independent                      Ambulation/Gait Ambulation/Gait assistance: Modified independent (Device/Increase time) Gait Distance (Feet): 150 Feet Assistive device: None Gait Pattern/deviations: Step-through pattern, Decreased stride length       General Gait Details: Pt tolerated distance well. No overt LOB. Mild lightheadedness. Pt reports gait not quite back to normal since surgeries (hip, back).  Stairs            Wheelchair Mobility     Tilt Bed    Modified Rankin (Stroke Patients Only)       Balance Overall balance assessment: Mild deficits observed, not formally tested                                            Pertinent Vitals/Pain Pain Assessment Pain Assessment: Faces Faces Pain Scale: Hurts a little bit Pain Location: abdomen Pain Descriptors / Indicators: Sore, Discomfort Pain Intervention(s): Monitored during session    Home Living Family/patient expects to be discharged to:: Private residence Living Arrangements: Spouse/significant other Available Help at Discharge: Family;Available 24 hours/day Type of Home: House Home Access: Stairs to enter Entrance Stairs-Rails: Doctor, General Practice of Steps: 4   Home Layout: Two level;Able to live on main level with bedroom/bathroom Home Equipment: Rolling Walker (2 wheels);Cane - single point;Tub bench;Crutches;Toilet riser;BSC/3in1      Prior Function Prior Level of Function : Independent/Modified Independent             Mobility Comments: no AD ADLs Comments: indep, assists her husband     Extremity/Trunk Assessment   Upper Extremity Assessment Upper Extremity Assessment: Overall WFL for tasks assessed    Lower Extremity Assessment Lower Extremity Assessment: Generalized weakness    Cervical / Trunk Assessment Cervical / Trunk Assessment: Normal  Communication   Communication Communication: No apparent difficulties    Cognition Arousal: Alert Behavior During Therapy: WFL for tasks assessed/performed   PT - Cognitive impairments: No apparent impairments  Following commands: Intact       Cueing Cueing Techniques: Verbal cues     General Comments      Exercises     Assessment/Plan    PT Assessment All further PT needs can be met in the next venue of care (could possibly benefit from outpatient PT but pt denies need for this-reports she goes to [facility])  PT Problem List         PT Treatment Interventions      PT Goals (Current goals can be found in the Care Plan section)  Acute Rehab PT Goals Patient Stated  Goal: home today PT Goal Formulation: All assessment and education complete, DC therapy    Frequency       Co-evaluation               AM-PAC PT 6 Clicks Mobility  Outcome Measure Help needed turning from your back to your side while in a flat bed without using bedrails?: None Help needed moving from lying on your back to sitting on the side of a flat bed without using bedrails?: None Help needed moving to and from a bed to a chair (including a wheelchair)?: None Help needed standing up from a chair using your arms (e.g., wheelchair or bedside chair)?: None Help needed to walk in hospital room?: None Help needed climbing 3-5 steps with a railing? : None 6 Click Score: 24    End of Session   Activity Tolerance: Patient tolerated treatment well Patient left: with call bell/phone within reach;with family/visitor present        Time: 8573-8565 PT Time Calculation (min) (ACUTE ONLY): 8 min   Charges:   PT Evaluation $PT Eval Low Complexity: 1 Low   PT General Charges $$ ACUTE PT VISIT: 1 Visit           Dannial SQUIBB, PT Acute Rehabilitation  Office: [phone]

## 2024-10-22 NOTE — Progress Notes (Signed)
 Called Carolyn Rangel d/t data loss. She is without distress and had changed her clothing to get ready for bed. She repositioned wearable and it now read as connected.  NO question s or concerns at this time.

## 2024-10-22 NOTE — Progress Notes (Signed)
 Spoke with the patient over the phone. She states she forgot she had a allergic reaction to Cipro  many years ago. It caused her dizziness and fatigue. Patient did receive PO Cipro  at 1334 today. Patient is currently reporting slight dizziness. Dr. Eldonna made aware. PO Cipro  switched to IV rocephin .

## 2024-10-22 NOTE — Plan of Care (Signed)
°  Problem: Education: Goal: Knowledge of General Education information will improve Description: Including pain rating scale, medication(s)/side effects and non-pharmacologic comfort measures Outcome: Progressing   Problem: Health Behavior/Discharge Planning: Goal: Ability to manage health-related needs will improve Outcome: Progressing   Problem: Clinical Measurements: Goal: Ability to maintain clinical measurements within normal limits will improve Outcome: Progressing Goal: Diagnostic test results will improve Outcome: Progressing Goal: Respiratory complications will improve Outcome: Progressing   Problem: Nutrition: Goal: Adequate nutrition will be maintained Outcome: Progressing   Problem: Coping: Goal: Level of anxiety will decrease Outcome: Progressing   Problem: Elimination: Goal: Will not experience complications related to bowel motility Outcome: Progressing   Problem: Pain Managment: Goal: General experience of comfort will improve and/or be controlled Outcome: Progressing   Problem: Safety: Goal: Ability to remain free from injury will improve Outcome: Progressing

## 2024-10-22 NOTE — Progress Notes (Signed)
 Pt admitted to [facility] program. Pt was transported via QRV2 by EMT Nat.   Pt is able to ambulate without assistance up 3 front steps into home. Home safety assessment completed. Photos uploaded to teams. Pt's home is spacious and she stays on ground level of home. All walkways clear and well lit. Pt has walk in shower with bench. Pt also has 2 small, very friendly dogs.   All Current health equipment set up and explained to pt by EMT Nat.  Meds set up in clear medication bins. Meds administered as noted. EMT Desmone dropped off IV Pump/ Pole and new ordered Rocephin  so that I could administer same.  Pt requests IV to be changed due to original IV being in her [facility], causing issues when she receives meds. I started a new IV as noted with Curos cap.   Skin check completed with RN. Med reconciliation completed with RN.  Pt shown welcome binder and [facility] number. Pt encouraged to call on the tablet or by phone any time with any questions.

## 2024-10-22 NOTE — Plan of Care (Addendum)
 Hospital at Home Interim Note    MYSTIC LABO   FMW:[id number]  DOB: 1956-11-03  DOA: 10/21/2024     0 Date of Service: 10/22/2024    HPI:  Ms. Carolyn Rangel is a 68 year old female with history of hepatic steatosis, history of cholecystectomy, hypertension, GERD, insomnia.   10/21/2024: Patient presents to the ED for chief concerns of of abdominal pain, nausea, vomiting, diarrhea.   Vitals at the time of my evaluation showed t of 98.3, rr 20, hr 70, blood pressure 141/79, SpO2 97% on room air.   Serum sodium is 142, potassium 3.8, chloride 106, bicarb 22, BUN of 10, serum creatinine 1.15, eGFR 52, nonfasting blood glucose 104, WBC 6.2, hemoglobin 13.1, platelets of 266.   Lipase was 56.  COVID/flu A/influenza B/RSV PCR were negative  Subjective:  Overall improvement in diarrhea, abdominal pain overnight. Would like to be transitioned to oral pain regimen in addition to ambulation. After discussing with patient and husband at the bedside, the patient and husband are agreeable to the hospital at home program. Was able to ambulate >150 ft without assistance while I walked with patient.   Hospital Problems Assessment and Plan: * Intractable nausea and vomiting Diarrhea Suspect secondary to gastroenteritis.  Supportive care, antiemetics CT scan showing small bowel enteritis.   Started on cipro  and flagyl  12/10 with goal treatment x 7 days  + cipro  intolerance--> transitioned to rocephin   Stool studies have been ordered including C. difficile and GI panel CLD, advance as tolerate Transitioned from IV pain medication to orals (pt requesting transition to orals)   Diarrhea Patient had 8 episodes of diarrhea yesterday and loose stool today- overall improving  C. difficile PCR screening and GI panel No antidiarrheal medication will be given until the above panel is negative for infectious diarrhea Will otherwise continue to monitor   Enteritis Suspect viral etiology given  overall presentation  Covering for bacterial etiology given overall presentation- clinically improving  Follow  GERD (gastroesophageal reflux disease) Protonix  40 mg daily resumed  Insomnia Temazepam  15 mg nightly as needed for sleep resumed  Essential hypertension Home amlodipine  2.5 mg every evening, losartan  50 mg daily, metoprolol  100 mg every evening resumed on admission        Objective Vital signs were reviewed and unremarkable.  Exam Physical Exam Constitutional:      Appearance: She is obese.  HENT:     Head: Normocephalic and atraumatic.     Nose: Nose normal.     Mouth/Throat:     Mouth: Mucous membranes are moist.  Eyes:     Pupils: Pupils are equal, round, and reactive to light.  Pulmonary:     Effort: Pulmonary effort is normal.  Skin:    General: Skin is warm.  Neurological:     General: No focal deficit present.  Psychiatric:        Mood and Affect: Mood normal.      Labs / Other Information There are no new results to review at this time.   Hospital at Home Admission Criteria Checklist:  Formal consent explained in detail and signed at the bedside: yes Patient meets inpatient admission criteria (see below for further details) yes Is pt Medicare FFS/[employer] Medicare-Medicaid, [employer], [employer] Medicare, [employer] Advantage, [employer] ( required for initial launch with plan to expand)? yes Lives within 25 mil/ 30 min from [facility] within [place](pt may stay with family member during admission who lives within 25 miles or 30 min from [facility] [employer]  [place])? yes Hemodynamically stable with relatively low risk of clinical deterioration-not requiring ICU? yes Age >55? yes Does not require frequent touch-points or complex interventions/medications (ie Titrated Infusions (IV insulin, heparin drips, vasoactive drips, use of infused or injectable controlled substances, patients on insulin)? no Any Behavioral Health comorbidities likely to  increase risk for in-home care (ie Acute delirium or experiencing a marked altered mental status and cause is not a treatable condition in the home)? no Has the patient been on BIPAP during course of ED evaluation or hospitalization? no IF YES, Has the patient been off of BIPAP for >24 hours(If NO-THEN PATIENT DOES MEET INCLUSION CRITERIA)? not applicable On Room Air or Needs oxygen at home (<6L)? is not on home oxygen therapy. Active safety concerns (ie Unable to use bedside commode independently and lacks caregiver support for safety- needs SNF placement, unable to obtain IV access)? no Has skin check been performed? no  Has Physical Therapy screened the patient? yes  Common admission diagnoses including: CAP, COPD Exacerbation, Acute on chronic heart failure, Cellulitis, UTI , dehydration, acute resp failure with hypoxia (requiring <5L)   Social Screening:  - Has the family been directly contacted about Hospital at Home program with consent obtained (if yes- please document who was spoken to with name and phone number)? yes  -Was the family approached about the use of TOC pharmacy for medications at discharge? no Denies significant ETOH intake? not applicable Does not smoke and understands may not smoke in the presence of oxygen? no Patient states able to use iPad/phone for communication/has family who is able to use? yes Patient has agreed to be compliant with medication and treatment regimen of the program? yes Any active drug use in patient or primary caregiver including daily dosing of methadone? no Stable home environment ( access to appropriate heating in cold conditions and/or appropriate air conditioning in hot conditions and/or no running water/electricity)? yes  No aggressive pets at home? no Firearm present? yes  With ability or willingness to store them unloaded in a locked case for duration of hospitalization? yes Ambulatory? yes  no difficulty Bed bugs present on home  evaluation? no Family support system in place? yes Patient feels safe at home and does not endorse any violence? yes Any actively decompensated behavioral health issues including agitation/aggressive behavior? no  Patient requests food to be provided by hospital home program? no PT/OT eval completed and not requiring SNF, ALF, inpatient rehab? yes  To be admitted to the [facility] program, a patient generally must meet the following: 1. Requirement for Inpatient Level of Care: The patient's condition must necessitate an inpatient level of care. This is typically indicated by one or more of the following, depending on their specific diagnosis:  Persistent tachycardia despite appropriate treatment (e.g., for Heart Failure, UTI). Persistent tachypnea (rapid breathing) or dyspnea (shortness of breath) that hasn't improved sufficiently with observation care (e.g., for Heart Failure, Pneumonia, Viral Illness, COVID). Hypoxemia (low oxygen levels), such as a new need for oxygen, an increased need from baseline, or specific oxygen saturation levels (e.g., SpO2 <90-94% depending on the condition) that persist despite observation (e.g., for Heart Failure, COPD, Pneumonia, Viral Illness, COVID). Need for Intravenous (IV) hydration due to an inability to maintain oral hydration, which persists despite observation care (e.g., for Cellulitis, UTI, Viral Illness, COVID). Specific to Heart Failure: Persistent pulmonary edema, indicated by a new oxygen need, lack of improvement with IV diuretics, and ongoing tachypnea/dyspnea. Specific to COPD: A decrease in known  baseline resting oxygen saturation (SpO2) by 4% or more, or an increase in pre-existing supplemental oxygen requirements, which persists despite observation and requires continued close monitoring. Specific to Pneumonia: A Pneumonia Severity Index (PSI) class IV (moderate risk). Specific to Cellulitis: Failure of outpatient antibiotic therapy  (indicated by progression or no improvement after a minimum of 48 hours on an adequate regimen) or a clinical presentation (like acuity or rapidity of progression) that requires the intensity of monitoring found in an inpatient setting. Specific to UTI: Persistence or worsening of clinical findings like fever, pain, or dehydration despite observation care; presence of significant uropathy; suspected infection of an indwelling prosthetic device, stent, implant, or graft; or pregnancy with suspected pyelonephritis.  2. Appropriateness for Hospital at Home Setting: The patient's overall clinical picture, including the severity of their illness, their care needs, and their medical history and comorbidities, must be suitable for management in the Hospital at Home environment. This essentially means that none of the exclusion criteria (listed below) are met.  Unified Exclusion Criteria for Hospital at Home Admission: A patient would not be eligible for Hospital at Home if any of the following are present: Hemodynamic Instability: Hypotension (low blood pressure) is present. Respiratory Instability or Needs Beyond Program Capability: There is a new need for invasive or noninvasive ventilatory assistance (like BiPAP or a ventilator). Oxygenation is not sufficient, generally indicated if an FiO2 (fraction of inspired oxygen) of 45% (which is about 6 Liters/minute via nasal cannula) or more is required to keep oxygen saturation (SpO2) at 90% or greater. Monitoring or Procedural Needs Beyond Program Capability: There is a need for invasive monitoring, such as a pulmonary artery catheter or an arterial line. There is a need for immediate-response telemetry monitoring (for dangerous arrhythmia detection and subsequent immediate intervention). The required medication regimen is beyond the capabilities of Hospital at Home (e.g., dosing intervals are too frequent for home administration). There is a need for a  procedure that cannot be performed by the [facility] at [facility] team (e.g., significant wound debridement or abscess drainage for cellulitis, or percutaneous nephrostomy for a complicated UTI). Significant Organ Dysfunction or Markers of Severe Illness: Mental status is not at baseline, or there is altered mental status suggestive of inadequate perfusion. Renal (kidney) function is unstable or showing an ongoing decline. There is evidence of inadequate perfusion, such as metabolic acidosis or myocardial ischemia. Uncompensated acidosis is present. Condition-Specific Severity or Complications Making Home Care Unsuitable: For Heart Failure: Known severe cardiac valvular disease (e.g., aortic stenosis, mitral regurgitation); or severe peripheral edema that impairs the ability to urinate or ambulate. For COPD: Known concurrent comorbidity or finding that indicates a higher-risk COPD exacerbation (e.g., pulmonary fibrosis, cavitation, pleural effusion, pneumothorax, rib fracture). For Pneumonia: Pneumonia Severity Index (PSI) class V (indicating high risk for inpatient mortality); known concurrent comorbidity or finding that indicates higher-risk pneumonia (e.g., pulmonary fibrosis, cavitation, large or loculated pleural effusion); or a concomitant serious infectious process like endocarditis or empyema. For Cellulitis: Orbital, periorbital, or necrotizing infection is suspected; or a concomitant serious infectious process like endocarditis, septic emboli, or septic joint space infection. For UTI: Urinary tract obstruction (e.g., kidney stone, bladder outlet obstruction); or a concomitant serious infectious process like endocarditis or septic emboli. For Viral Illness & COVID-19: A concomitant serious infectious process like endocarditis or empyema.  General Comorbidities or Status:  The patient is significantly immunosuppressed (this applies to Pneumonia, Cellulitis, UTI, Viral Illness, and COVID-19). The  patient meets inpatient admission criteria for a second  diagnosis, or has care needs beyond the capabilities of Hospital at Home due to an active clinically significant comorbidity. (This is a general exclusion across all listed conditions)  Time spent: >60 minutes [facility] 10/22/2024, 6:10 PM

## 2024-10-22 NOTE — Care Management Obs Status (Signed)
 MEDICARE OBSERVATION STATUS NOTIFICATION   Patient Details  Name: Carolyn Rangel MRN: [id number] Date of Birth: 03/31/1956   Medicare Observation Status Notification Given:  Yes    Heather DELENA Saltness, LCSW 10/22/2024, 12:26 PM

## 2024-10-22 NOTE — Plan of Care (Signed)
°  Problem: Education: Goal: Knowledge of General Education information will improve Description: Including pain rating scale, medication(s)/side effects and non-pharmacologic comfort measures Outcome: Progressing   Problem: Health Behavior/Discharge Planning: Goal: Ability to manage health-related needs will improve Outcome: Progressing   Problem: Clinical Measurements: Goal: Ability to maintain clinical measurements within normal limits will improve Outcome: Progressing Goal: Will remain free from infection Outcome: Progressing Goal: Respiratory complications will improve Outcome: Progressing Goal: Cardiovascular complication will be avoided Outcome: Progressing   Problem: Activity: Goal: Risk for activity intolerance will decrease Outcome: Progressing   Problem: Elimination: Goal: Will not experience complications related to bowel motility Outcome: Progressing Goal: Will not experience complications related to urinary retention Outcome: Progressing   Problem: Safety: Goal: Ability to remain free from injury will improve Outcome: Progressing

## 2024-10-22 NOTE — Progress Notes (Signed)
 PROGRESS NOTE    Carolyn Rangel  FMW:[id number] DOB: [birth year] DOA: 10/21/2024 PCP: Larnell Hamilton, MD    Brief Narrative:  68 year old with history hepatic steatosis status post cholecystectomy, HTN, GERD, insomnia comes to the hospital with complaints of nausea vomiting and diarrhea.  Patient admitted to the hospital with concerns of gastroenteritis   Assessment & Plan:  Intractable nausea vomiting Diarrhea - Suspect secondary to gastroenteritis.  Supportive care, antiemetics - CT scan showing small bowel enteritis.  Will add Cipro  Flagyl  for total 7 days -Stool studies have been ordered including C. difficile and GI panel - CLD, advance as tolerated  GERD - PPI  Insomnia - Temazepam  at bedtime as needed  Essential hypertension - Norvasc , losartan , Toprol -XL.  IV as needed   DVT prophylaxis: enoxaparin  (LOVENOX ) injection 40 mg Start: 10/21/24 2200 Place TED hose Start: 10/21/24 1114      Code Status: Full Code Family Communication:   Status is: Observation The patient remains OBS appropriate and will d/c before 2 midnights.  Hopefully home tomorrow   PT Follow up Recs:   Subjective: Still having occasional abdominal pain   Examination:  General exam: Appears calm and comfortable  Respiratory system: Clear to auscultation. Respiratory effort normal. Cardiovascular system: S1 & S2 heard, RRR. No JVD, murmurs, rubs, gallops or clicks. No pedal edema. Gastrointestinal system: Abdomen is nondistended, soft and nontender. No organomegaly or masses felt. Normal bowel sounds heard. Central nervous system: Alert and oriented. No focal neurological deficits. Extremities: Symmetric 5 x 5 power. Skin: No rashes, lesions or ulcers Psychiatry: Judgement and insight appear normal. Mood & affect appropriate.                Diet Orders (From admission, onward)     Start     Ordered   10/21/24 1116  Diet clear liquid Room service appropriate? Yes; Fluid  consistency: Thin  Diet effective now       Question Answer Comment  Room service appropriate? Yes   Fluid consistency: Thin      10/21/24 1115            Objective: Vitals:   10/21/24 1652 10/21/24 1928 10/21/24 2322 10/22/24 0435  BP: (!) 145/72 136/71 133/70 (!) 141/69  Pulse: 69 67 69 68  Resp: 18 20    Temp: 98.5 F (36.9 C) 98.1 F (36.7 C) 98.2 F (36.8 C) 98.4 F (36.9 C)  TempSrc:   Oral Oral  SpO2: 97% 98% 97% 95%  Weight:      Height:        Intake/Output Summary (Last 24 hours) at 10/22/2024 1220 Last data filed at 10/22/2024 1146 Gross per 24 hour  Intake 1728.91 ml  Output --  Net 1728.91 ml   Filed Weights   10/21/24 0805  Weight: 77.1 kg    Scheduled Meds:  amLODipine   2.5 mg Oral QPM   enoxaparin  (LOVENOX ) injection  40 mg Subcutaneous QHS   losartan   50 mg Oral Daily   metoprolol  succinate  100 mg Oral QPM   pantoprazole   40 mg Oral Daily   Continuous Infusions:  promethazine  (PHENERGAN ) injection (IM or IVPB) Stopped (10/21/24 1831)    Nutritional status     Body mass index is 27.44 kg/m.  Data Reviewed:   CBC: Recent Labs  Lab 10/21/24 0827 10/22/24 0535  WBC 6.2 5.0  NEUTROABS 4.5  --   HGB 13.1 11.3*  HCT 39.6 36.8  MCV 98.0 107.3*  PLT 266 229  Basic Metabolic Panel: Recent Labs  Lab 10/21/24 0827 10/22/24 0535  NA 142 139  K 3.8 3.5  CL 106 104  CO2 22 26  GLUCOSE 104* 99  BUN 10 7*  CREATININE 1.15* 0.99  CALCIUM 9.6 8.9   GFR: Estimated Creatinine Clearance: 57 mL/min (by C-G formula based on SCr of 0.99 mg/dL). Liver Function Tests: Recent Labs  Lab 10/21/24 0827  AST 27  ALT 24  ALKPHOS 110  BILITOT 0.7  PROT 6.3*  ALBUMIN  4.2   Recent Labs  Lab 10/21/24 0827  LIPASE 56*   No results for input(s): AMMONIA in the last 168 hours. Coagulation Profile: No results for input(s): INR, PROTIME in the last 168 hours. Cardiac Enzymes: No results for input(s): CKTOTAL, CKMB,  CKMBINDEX, TROPONINI in the last 168 hours. BNP (last 3 results) No results for input(s): PROBNP in the last 8760 hours. HbA1C: No results for input(s): HGBA1C in the last 72 hours. CBG: No results for input(s): GLUCAP in the last 168 hours. Lipid Profile: No results for input(s): CHOL, HDL, LDLCALC, TRIG, CHOLHDL, LDLDIRECT in the last 72 hours. Thyroid Function Tests: No results for input(s): TSH, T4TOTAL, FREET4, T3FREE, THYROIDAB in the last 72 hours. Anemia Panel: No results for input(s): VITAMINB12, FOLATE, FERRITIN, TIBC, IRON, RETICCTPCT in the last 72 hours. Sepsis Labs: No results for input(s): PROCALCITON, LATICACIDVEN in the last 168 hours.  Recent Results (from the past 240 hours)  Resp panel by RT-PCR (RSV, Flu A&B, Covid) Anterior Nasal Swab     Status: None   Collection Time: 10/21/24  8:08 AM   Specimen: Anterior Nasal Swab  Result Value Ref Range Status   SARS Coronavirus 2 by RT PCR NEGATIVE NEGATIVE Final    Comment: (NOTE) SARS-CoV-2 target nucleic acids are NOT DETECTED.  The SARS-CoV-2 RNA is generally detectable in upper respiratory specimens during the acute phase of infection. The lowest concentration of SARS-CoV-2 viral copies this assay can detect is 138 copies/mL. A negative result does not preclude SARS-Cov-2 infection and should not be used as the sole basis for treatment or other patient management decisions. A negative result may occur with  improper specimen collection/handling, submission of specimen other than nasopharyngeal swab, presence of viral mutation(s) within the areas targeted by this assay, and inadequate number of viral copies(<138 copies/mL). A negative result must be combined with clinical observations, patient history, and epidemiological information. The expected result is Negative.  Fact Sheet for Patients:  bloggercourse.com  Fact Sheet for Healthcare  Providers:  seriousbroker.it  This test is no t yet approved or cleared by the [place]  FDA and  has been authorized for detection and/or diagnosis of SARS-CoV-2 by FDA under an Emergency Use Authorization (EUA). This EUA will remain  in effect (meaning this test can be used) for the duration of the COVID-19 declaration under Section 564(b)(1) of the Act, 21 U.S.C.section 360bbb-3(b)(1), unless the authorization is terminated  or revoked sooner.       Influenza A by PCR NEGATIVE NEGATIVE Final   Influenza B by PCR NEGATIVE NEGATIVE Final    Comment: (NOTE) The Xpert Xpress SARS-CoV-2/FLU/RSV plus assay is intended as an aid in the diagnosis of influenza from Nasopharyngeal swab specimens and should not be used as a sole basis for treatment. Nasal washings and aspirates are unacceptable for Xpert Xpress SARS-CoV-2/FLU/RSV testing.  Fact Sheet for Patients: bloggercourse.com  Fact Sheet for Healthcare Providers: seriousbroker.it  This test is not yet approved or cleared by the [place]   FDA and has been authorized for detection and/or diagnosis of SARS-CoV-2 by FDA under an Emergency Use Authorization (EUA). This EUA will remain in effect (meaning this test can be used) for the duration of the COVID-19 declaration under Section 564(b)(1) of the Act, 21 U.S.C. section 360bbb-3(b)(1), unless the authorization is terminated or revoked.     Resp Syncytial Virus by PCR NEGATIVE NEGATIVE Final    Comment: (NOTE) Fact Sheet for Patients: bloggercourse.com  Fact Sheet for Healthcare Providers: seriousbroker.it  This test is not yet approved or cleared by the [place]  FDA and has been authorized for detection and/or diagnosis of SARS-CoV-2 by FDA under an Emergency Use Authorization (EUA). This EUA will remain in effect (meaning this test can be  used) for the duration of the COVID-19 declaration under Section 564(b)(1) of the Act, 21 U.S.C. section 360bbb-3(b)(1), unless the authorization is terminated or revoked.  Performed at [facility], [address] [place], [place] [address]          Radiology Studies: CT L-SPINE NO CHARGE Result Date: 10/21/2024 EXAM: CT OF THE LUMBAR SPINE WITHOUT CONTRAST 10/21/2024 09:35:11 AM TECHNIQUE: CT of the lumbar spine was performed without the administration of intravenous contrast. Multiplanar reformatted images are provided for review. Automated exposure control, iterative reconstruction, and/or weight based adjustment of the mA/kV was utilized to reduce the radiation dose to as low as reasonably achievable. COMPARISON: None available. CLINICAL HISTORY: FINDINGS: BONES AND ALIGNMENT: Normal vertebral body heights. No acute fracture or suspicious bone lesion. Status post interbody fusion and bilateral posterolateral spinal fixation at L4-L5. There is slight anterolisthesis at the operative level. DEGENERATIVE CHANGES: The spinal canal and neural foramina appear patent. SOFT TISSUES: No acute abnormality. IMPRESSION: 1. Status post interbody fusion and bilateral posterolateral spinal fixation at L4-5 with slight anterolisthesis at the operative level. The spinal canal and neural foramina appear patent. Electronically signed by: Evalene Coho MD 10/21/2024 09:56 AM EST RP Workstation: [id number]   CT ABDOMEN PELVIS W CONTRAST Result Date: 10/21/2024 EXAM: CT ABDOMEN AND PELVIS WITH CONTRAST 10/21/2024 09:35:11 AM TECHNIQUE: CT of the abdomen and pelvis was performed with the administration of 100 mL of iohexol  (OMNIPAQUE ) 300 MG/ML solution. Multiplanar reformatted images are provided for review. Automated exposure control, iterative reconstruction, and/or weight-based adjustment of the mA/kV was utilized to reduce the radiation dose to as low as reasonably achievable.  COMPARISON: CT of the abdomen and pelvis dated 03/03/2023. CLINICAL HISTORY: Bowel obstruction suspected. FINDINGS: LOWER CHEST: There is streaky atelectasis/scarring present dependently within the lower lobes bilaterally. LIVER: There is diffuse fatty infiltration of the liver. GALLBLADDER AND BILE DUCTS: The patient is status post cholecystectomy. No biliary ductal dilatation. SPLEEN: No acute abnormality. PANCREAS: No acute abnormality. ADRENAL GLANDS: No acute abnormality. KIDNEYS, URETERS AND BLADDER: No stones in the kidneys or ureters. No hydronephrosis. No perinephric or periureteral stranding. Urinary bladder is unremarkable. GI AND BOWEL: Stomach demonstrates no acute abnormality. There is abnormal thickening of the wall of several loops of the mid to distal small bowel and there is stranding of the adjacent fat. The terminal ileum is spared. The proximal small bowel is nondilated. There is no bowel obstruction. PERITONEUM AND RETROPERITONEUM: No ascites. No free air. VASCULATURE: Aorta is normal in caliber. LYMPH NODES: There are a few shotty mesenteric lymph nodes. REPRODUCTIVE ORGANS: The patient is status post hysterectomy and bilateral salpingo-oophorectomy. BONES AND SOFT TISSUES: The patient is also status post interbody fusion and bilateral posterolateral spinal fixation at L4-L5 and left  total hip arthroplasty. No acute osseous abnormality. No focal soft tissue abnormality. IMPRESSION: 1. Abnormal thickening of the wall of several loops of the mid to distal small bowel with adjacent fat stranding, concerning for enteritis. The terminal ileum is spared. 2. Nondilated proximal small bowel. No evidence of bowel obstruction. 3. Diffuse hepatic steatosis. Correlate with liver enzymes and metabolic risk factors. 4. Dependent subsegmental atelectasis/scar in the lower lobes. Electronically signed by: Evalene Coho MD 10/21/2024 09:55 AM EST RP Workstation: [id number]           LOS: 0 days    Time spent= 35 mins    Burgess JAYSON Dare, MD [facility]  If 7PM-7AM, please contact night-coverage  10/22/2024, 12:20 PM

## 2024-10-23 LAB — BASIC METABOLIC PANEL WITH GFR
Anion gap: 10 (ref 5–15)
BUN: 5 mg/dL — ABNORMAL LOW (ref 8–23)
CO2: 24 mmol/L (ref 22–32)
Calcium: 8.8 mg/dL — ABNORMAL LOW (ref 8.9–10.3)
Chloride: 104 mmol/L (ref 98–111)
Creatinine, Ser: 1.02 mg/dL — ABNORMAL HIGH (ref 0.44–1.00)
GFR, Estimated: 60 mL/min — ABNORMAL LOW (ref >60)
Glucose, Bld: 102 mg/dL — ABNORMAL HIGH (ref 70–99)
Potassium: 4.2 mmol/L (ref 3.5–5.1)
Sodium: 138 mmol/L (ref 135–145)

## 2024-10-23 LAB — MAGNESIUM: Magnesium: 1.2 mg/dL — ABNORMAL LOW (ref 1.7–2.4)

## 2024-10-23 LAB — PHOSPHORUS: Phosphorus: 3.2 mg/dL (ref 2.5–4.6)

## 2024-10-23 MED ORDER — MAGNESIUM SULFATE 4 GM/100ML IV SOLN
4.0000 g | Freq: Once | INTRAVENOUS | Status: AC
  Administered 2024-10-23: 4 g via INTRAVENOUS
  Filled 2024-10-23: qty 100

## 2024-10-23 MED ORDER — VITAMIN D3 25 MCG (1000 UNIT) PO TABS
2000.0000 [IU] | ORAL_TABLET | Freq: Every day | ORAL | Status: DC
  Administered 2024-10-23 – 2024-10-25 (×3): 2000 [IU] via ORAL
  Filled 2024-10-23 (×4): qty 2

## 2024-10-23 MED ORDER — COENZYME Q10 300 MG PO CAPS: 1.0000 | ORAL_CAPSULE | Freq: Every day | ORAL | Status: DC

## 2024-10-23 MED ORDER — LOSARTAN POTASSIUM 50 MG PO TABS
50.0000 mg | ORAL_TABLET | Freq: Once | ORAL | Status: AC
  Administered 2024-10-23: 50 mg via ORAL

## 2024-10-23 MED ORDER — LOSARTAN POTASSIUM 50 MG PO TABS
50.0000 mg | ORAL_TABLET | Freq: Every day | ORAL | Status: DC
  Administered 2024-10-23: 50 mg via ORAL
  Filled 2024-10-23 (×2): qty 1

## 2024-10-23 MED ORDER — COQ10 200 MG PO CAPS: 300.0000 mg | ORAL_CAPSULE | Freq: Every day | ORAL | Status: DC

## 2024-10-23 MED FILL — Losartan Potassium Tab 50 MG: 100.0000 mg | ORAL | Qty: 2 | Status: AC

## 2024-10-23 NOTE — Progress Notes (Signed)
 Phone call completed with patient. Introduced myself as the CHARITY [facility]DRAISER for the evening and made patient aware that I am available all night via phone or tablet call if needed for anything. Patient states she's currently trying to eat some solid foods and will attempt to collect a stool sample overnight. Patient made aware of plan for medic to come out tonight to administer IV magnesium. Patient denies any other needs at this time.

## 2024-10-23 NOTE — Progress Notes (Signed)
 0113-Outbound call via phone: Alert for Skin Temperature (Median) was higher than 99.3 F for 1 hour. Patient value per Current health data 100.2 and 101.1. Patient reported that she is not experiencing S/Sx related to fever or pain.    0135--Current Health data no longer connected, currently showing out of range and disconnected.     0219--Wearable Backfilling per Current Health data.

## 2024-10-23 NOTE — Progress Notes (Signed)
 H@H  medics Vinaya Sancho and Barnie out to see patient this evening for her second visit. On arrival, the patient welcomed us  at the door. On assessment, the patient has complaints of 3/10 diffuse abdominal pain with tenderness on palpation. The patient states that the pain was as great as a 9/10 this afternoon. Pt told us  that she took a norco at approximately 1445 for her abdominal pain but forgot to ring the nurse. The meds were counted at this time. Five pills were accounted for with six having been dispensed. The patient stated that this afternoon was the first time that she took one. A picture was sent to the virtual RN for verification. The rest of her medications were verified against the [facility] and administered with oversight from the RN. The IV was flushed and maintained. A dressing change was also performed due to the IV extension set leaking. No other tasks were needed at this time. The pt was left in care of her husband and herself. H@H  visit completed.

## 2024-10-23 NOTE — Progress Notes (Signed)
 1920--Video Call --RN introduction, Patient identifiers completed.  Patient sitting in the chair.  Patient A&O x4. [facility] EMT and Paramedic present. Patient denies pain, nausea, or episodes of vomiting since arrival home. Patient reported will soon prepare for a shower and be. Patient preferred PM medication early. Overnight monitoring plan, [facility] contact information reviewed. Patients encouraged to call as needed. Patient reported no additional questions to address at the time of the video call. Medication administration with RN oversight provided with Paramedic.

## 2024-10-23 NOTE — Progress Notes (Signed)
 Completed virtual rounds with MD,paramedic at patient bedside. POC reviewed and discussed ,patient voices understanding and agreement. Pt reminded to call RN for any needs, RN and MD available at all times. Pt voices understanding. Pt aware of next planned visit and next call from RN.

## 2024-10-23 NOTE — Progress Notes (Addendum)
 2347--Hypoxia x2 34 RR 88% RA. The patient reported that she does not have any SOB or signs of respiratory distress. Assessed if the patient was resting on the side the current health sensor was on, the patient confirmed she was lying on the sensor. Patient encouraged to reposition to allow sensor to be exposed for better connectivity. Reviewed continuing overnight plan, [facility] contact information also reviewed.

## 2024-10-23 NOTE — Assessment & Plan Note (Signed)
 Mg level today 1.2.   Likely from GI losses --4 g IV Mg to replace during next visit --Monitor Mg level & replace PRN

## 2024-10-23 NOTE — Plan of Care (Signed)
°  Problem: Education: Goal: Knowledge of General Education information will improve Description: Including pain rating scale, medication(s)/side effects and non-pharmacologic comfort measures Outcome: Progressing   Problem: Health Behavior/Discharge Planning: Goal: Ability to manage health-related needs will improve Outcome: Progressing   Problem: Clinical Measurements: Goal: Ability to maintain clinical measurements within normal limits will improve Outcome: Progressing Goal: Cardiovascular complication will be avoided Outcome: Progressing   Problem: Activity: Goal: Risk for activity intolerance will decrease Outcome: Progressing   Problem: Elimination: Goal: Will not experience complications related to urinary retention Outcome: Progressing   Problem: Pain Managment: Goal: General experience of comfort will improve and/or be controlled Outcome: Progressing   Problem: Safety: Goal: Ability to remain free from injury will improve Outcome: Progressing

## 2024-10-23 NOTE — Progress Notes (Signed)
 To pt residence for [facility]  morning visit with EMTP [facility]. Upon arrival this EMT accessed and documented vital signs. Labs were then drawn and transported to [facility].

## 2024-10-23 NOTE — Progress Notes (Signed)
 Hospital [facility] Daily Progress Note   Patient: Carolyn Rangel  MRN: [id number]  DOB: December 09, 1955  DOA: 10/21/2024  DOS: the patient was seen and examined on @TODAY @    Patient identified themself as Carolyn Rangel  DOB May 31, 1956  Medic Barnie Bud was present in the home during encounter and performed the assessment and physical exam. Patient was seen today via video conference; my physical location [place]    Brief hospital course: 68 year old with history hepatic steatosis, status post cholecystectomy, HTN, GERD, insomnia presented to the ED on 10/21/2024 for evaluation of nausea vomiting and diarrhea.  Patient was admitted to the hospital with concerns of gastroenteritis.    Patient was started on supportive care with IV fluids, requiring IV pain medications for severe abdominal pain, IV antiemetics.  Stool studies were ordered to evaluate for infectious etiology of diarrhea.  10/22/24 - patient transferred to Hospital [facility] Home having been transitioned of IV pain meds and fluids.  Tolerating clear >> full liquid diet.    10/23/24 - pt overall feels better, still drinking only liquids and not ready to attempt solid foods.  Belly pain better yesterday, recurred on and off overnight.  Few bouts of diarrhea overnight.  No fever/chills.     Assessment and Plan:  * Intractable nausea and vomiting In the setting of gastroenteritis.  Slowly improving.   12/11 -patient reporting intermittent abdominal pain and diarrhea are ongoing but nausea vomiting has improved. --Continue supportive care with antiemetics as needed.  Enteritis Viral etiology is most likely.  Stool studies are pending.  Patient gradually improving but continues to have intermittent abdominal pain and diarrhea.  No fevers or chills. --Will continue empiric antibiotic coverage for now with IV Rocephin , p.o. Flagyl  --Discussed with patient advancing diet as tolerated --she is currently taking only liquids had  tried full liquids in the hospital --Patient seems to be hydrating adequately, no need for further IV fluids at this time --Monitor fever curve, CBC and electrolytes --Pain control and antiemetics as needed  Diarrhea In setting of gastroenteritis.   --Stool studies pending collection: C. Diff & GI panel --Hold off antidiarrheals pending results  Hypomagnesemia Mg level today 1.2.   Likely from GI losses --4 g IV Mg to replace during next visit --Monitor Mg level & replace PRN  GERD (gastroesophageal reflux disease) -- On Protonix  40 mg daily   Insomnia Continue home temazepam   Essential hypertension 12/11: BPs elevated 154/91 -- Continue home amlodipine , losartan , metoprolol   --Continue to monitor and titrate regimen as needed     Patient Active Problem List   Diagnosis Date Noted   Intractable nausea and vomiting 10/21/2024    Priority: High   Diarrhea 10/21/2024    Priority: High   Enteritis 10/21/2024    Priority: High   Hypomagnesemia 10/23/2024    Priority: Medium    Insomnia 10/21/2024    Priority: Low   GERD (gastroesophageal reflux disease) 10/21/2024    Priority: Low   Essential hypertension 10/21/2024   Spondylolisthesis at L4-L5 level 04/30/2024      Significant Events: Admitted 10/21/2024 Transferred to hospital at home 10/22/2024    Subjective / Interval 24 hour History:  Patient seen virtually during a.m. medic visit to the home this morning.  She reports yesterday her abdomen felt pretty good but did have recurrent bouts of abdominal pain again overnight with some mild nausea no vomiting.  She reports a few episodes of diarrhea overnight as well.  No fevers or  chills.  Regarding p.o. intake, she reports drinking plenty of fluids and that is all she has been having.  Did not eat anything last night.  Had tried full liquids in the hospital and tolerated some pudding and grits.  Does not feel ready to attempt advancing to more solid foods yet today.   We discussed slowly advancing as tolerated and sticking with bland foods.  Patient agreeable.     Admission Labs: CMP notable for glucose 104, creatinine 1.15, lipase 56 CBC normal Viral PCR's negative for COVID, flu A/B and RSV  Admission Imaging Studies: CT abdomen pelvis with contrast 12/9 -- several loops of mid-distal small bowel with abnormal wall thickening, adjacent fat stranding consistent with enteritis.  Terminal ileum spared.  CT Lumbar sping 12/9 -- s/pL4-5 fusion and bilateral posterolateral spinal fixation.  No acute findings. Patent spinal canal and neural foramina.  Significant Labs: Cr trend 1.15 >> 0.99 >> 1.02  Significant Imaging Studies: As above  Antibiotic Therapy: Anti-infectives (From admission, onward)    Start     Dose/Rate Route Frequency Ordered Stop   10/22/24 2200  cefTRIAXone  (ROCEPHIN ) 2 g in sodium chloride  0.9 % 100 mL IVPB        2 g 200 mL/hr over 30 Minutes Intravenous Every 24 hours 10/22/24 1725     10/22/24 1315  ciprofloxacin  (CIPRO ) tablet 500 mg  Status:  Discontinued        500 mg Oral 2 times daily 10/22/24 1221 10/22/24 1725   10/22/24 1315  metroNIDAZOLE  (FLAGYL ) tablet 500 mg        500 mg Oral Every 12 hours 10/22/24 1221         Procedures: none  Consultants: none          Physical Exam:    10/23/2024    5:38 PM 10/23/2024    5:16 PM 10/23/2024    9:00 AM  Vitals with BMI  Weight   178 lbs 3 oz  BMI   28.78  Systolic 168 177 845  Diastolic 64 86 91  Pulse  73 64      General exam: awake, alert, no acute distress HEENT: hearing grossly normal  Respiratory system: on room air, normal respiratory effort. Lungs CTAB Cardiovascular system:  Gastrointestinal system: soft, mild tenderness, +bowel sounds. Central nervous system: A&O x 4. no gross focal neurologic deficits, normal speech Psychiatry: normal mood, congruent affect, judgement and insight appear normal    Data Reviewed:  See significant  labs above   Family Communication:   Disposition: Status is: Inpatient Remains inpatient appropriate because: needs further improvement in enteritis symptoms, remains on IV therapies   Planned Discharge Destination: Home    Time spent: 45 minutes  Author: Burnard DELENA Cunning, DO [facility]  07/03/2024 7:00 AM  For on call review [email or web address].

## 2024-10-23 NOTE — Progress Notes (Incomplete)
 Arrived to patient   she had just had a shower   she states she feels good this morning   she asked if she could get her hair dry before we started her visit, she was told yes and to take her time    medications were given   she has asked about her lasartan not being given   her vitals are stable   blood was pulled by EMT and taking back to the lab   she states some pain   about a level of 3   she states she has had a couple episodes of loose stools    IMM was signed and it was explained that she could be possibly discharged within the next 48 hours    it was signed 10/23/24   9:20am  lung sounds are clear and bilateral   abdominal sounds are moving   patient isnt in any distress

## 2024-10-23 NOTE — Progress Notes (Signed)
 Evening visit by paramedic, blood pressure elevated 168/78, home dose of losartan  verified by prescription bottle, extra 50 mg losartan  given to equal pt's 100mg  usual daily dose (per Dr. Fausto). Pt is aware to collect cdiff specimen if she has a stool.

## 2024-10-23 NOTE — Plan of Care (Signed)
°  Problem: Health Behavior/Discharge Planning: Goal: Ability to manage health-related needs will improve Outcome: Progressing   Problem: Clinical Measurements: Goal: Ability to maintain clinical measurements within normal limits will improve Outcome: Progressing   Problem: Clinical Measurements: Goal: Will remain free from infection Outcome: Progressing   Problem: Clinical Measurements: Goal: Cardiovascular complication will be avoided Outcome: Progressing   Problem: Skin Integrity: Goal: Risk for impaired skin integrity will decrease Outcome: Progressing   Problem: Elimination: Goal: Will not experience complications related to bowel motility Outcome: Progressing Goal: Will not experience complications related to urinary retention Outcome: Progressing

## 2024-10-23 NOTE — Progress Notes (Signed)
 Video call completed with patient and medic. Patient sitting in recliner receiving her IV Mag. Patient denies pain or discomfort at this time. Patient made aware that if she needs prn pain medication to please call RN as she can have it anytime after 22:45. No other needs at this time. Medic will administer patient's bedtime medication while in the home.

## 2024-10-23 NOTE — Progress Notes (Signed)
 Losartan  restarted ,EMT delivered med to patient's home. RN haematologist via camera for pt to self administer. Pt voiced understanding to call for needs.

## 2024-10-24 ENCOUNTER — Other Ambulatory Visit

## 2024-10-24 LAB — GASTROINTESTINAL PANEL BY PCR, STOOL (REPLACES STOOL CULTURE)

## 2024-10-24 LAB — CBC
HCT: 38.2 % (ref 36.0–46.0)
Hemoglobin: 12.7 g/dL (ref 12.0–15.0)
MCH: 33.3 pg (ref 26.0–34.0)
MCHC: 33.2 g/dL (ref 30.0–36.0)
MCV: 100.3 fL — ABNORMAL HIGH (ref 80.0–100.0)
Platelets: 293 K/uL (ref 150–400)
RBC: 3.81 MIL/uL — ABNORMAL LOW (ref 3.87–5.11)
RDW: 13.7 % (ref 11.5–15.5)
WBC: 6.2 K/uL (ref 4.0–10.5)
nRBC: 0 % (ref 0.0–0.2)

## 2024-10-24 LAB — C DIFFICILE QUICK SCREEN W PCR REFLEX
C Diff antigen: NEGATIVE
C Diff interpretation: NOT DETECTED
C Diff toxin: NEGATIVE

## 2024-10-24 LAB — BASIC METABOLIC PANEL WITH GFR
Anion gap: 9 (ref 5–15)
BUN: <5 mg/dL — ABNORMAL LOW (ref 8–23)
CO2: 27 mmol/L (ref 22–32)
Calcium: 8.7 mg/dL — ABNORMAL LOW (ref 8.9–10.3)
Chloride: 102 mmol/L (ref 98–111)
Creatinine, Ser: 0.96 mg/dL (ref 0.44–1.00)
GFR, Estimated: >60 mL/min (ref >60)
Glucose, Bld: 106 mg/dL — ABNORMAL HIGH (ref 70–99)
Potassium: 3.7 mmol/L (ref 3.5–5.1)
Sodium: 138 mmol/L (ref 135–145)

## 2024-10-24 LAB — MAGNESIUM: Magnesium: 1.8 mg/dL (ref 1.7–2.4)

## 2024-10-24 MED ORDER — CEFADROXIL 500 MG PO CAPS
500.0000 mg | ORAL_CAPSULE | Freq: Two times a day (BID) | ORAL | Status: DC
  Administered 2024-10-24: 500 mg via ORAL
  Filled 2024-10-24 (×4): qty 1

## 2024-10-24 MED ADMIN — Losartan Potassium Tab 50 MG: 100 mg | ORAL | NDC 68084034711

## 2024-10-24 NOTE — Progress Notes (Signed)
 Virtual visit with Carolyn Rangel. She is no distress and has no c/o of pain. She a mild upset stomach from eating eggs today. HS medication administered. She had no questions or concerns. I reinforced contact options and encouraged calling with anything that comes up overnight.

## 2024-10-24 NOTE — Progress Notes (Signed)
 Patient reports a bowel movement this morning. Pt denies pain, nausea, and vomiting. Plan of care reviewed with patient. Plan for paramedic visit between [phone] today.

## 2024-10-24 NOTE — Progress Notes (Signed)
 OK to discontinue PIV per Dr. Fausto.

## 2024-10-24 NOTE — Plan of Care (Signed)

## 2024-10-24 NOTE — Progress Notes (Signed)
 [facility] Daily Progress Note   Patient: Carolyn Rangel  MRN: [id number]  DOB: 22-Jun-1956  DOA: 10/21/2024  DOS: the patient was seen and examined on @TODAY @    Patient identified themself as Carolyn Rangel  DOB 1956-10-17  Medic Barnie Bud was present in the home during encounter and performed the assessment and physical exam. Patient was seen today via video conference; my physical location [facility]    Brief hospital course: 68 year old with history hepatic steatosis, status post cholecystectomy, HTN, GERD, insomnia presented to the ED on 10/21/2024 for evaluation of nausea vomiting and diarrhea.  Patient was admitted to the hospital with concerns of gastroenteritis.    Patient was started on supportive care with IV fluids, requiring IV pain medications for severe abdominal pain, IV antiemetics.  Stool studies were ordered to evaluate for infectious etiology of diarrhea.  10/22/24 - patient transferred to [facility] having been transitioned of IV pain meds and fluids.  Tolerating clear >> full liquid diet.    10/23/24 - pt overall feels better, still drinking only liquids and not ready to attempt solid foods.  Belly pain better yesterday, recurred on and off overnight.  Few bouts of diarrhea overnight.  No fever/chills.  Mg 1.2 was replaced IV in the evening.   10/24/24 -patient reports no abdominal pain this morning, tolerated first small amount of solid food yesterday evening.  Diarrhea this morning but is improving.  Stool sample collected.    Assessment and Plan:  * Intractable nausea and vomiting In the setting of gastroenteritis.   12/12 this is mostly resolved, patient reporting minimal intermittent nausea --Continue supportive care with antiemetics as needed.  Enteritis Viral etiology is most likely.  Stool studies are pending.  Patient gradually improving but continues to have intermittent abdominal pain and diarrhea.  No fevers or chills. --  Patient has been treated with empiric antibiotics with IV Rocephin , p.o. Flagyl  --12/12 -day 3 of antibiotics.  Transition IV Rocephin  to p.o. Duricef to complete 5-day course --Patient advancing diet as tolerated --tolerated first solid foods yesterday evening 12/11 --Patient seems to be hydrating adequately, no need for further IV fluids at this time --Monitor fever curve, CBC and electrolytes --Pain control and antiemetics as needed  Diarrhea In setting of gastroenteritis.   C. difficile and GI panel ordered on admission. 12/12 -stool sample collected --C. difficile order has expired, now on hospital day 3 would need to be ordered by ID.  I will touch base with them but given no leukocytosis and diarrhea improving my suspicion for C. difficile is low --Follow GI panel  Hypomagnesemia Likely from GI losses 12/11  - Mg level today 1.2, replaced with 4 g IV Mg-sulfate --Repeat Mg level today pending  GERD (gastroesophageal reflux disease) -- On Protonix  40 mg daily   Insomnia Continue home temazepam   Essential hypertension 12/12: BPs elevated with systolic ranging 859-819.  This morning 164/89 prior to her morning medications -- Continue home amlodipine , losartan , metoprolol   --Continue to monitor and titrate regimen as needed     Patient Active Problem List   Diagnosis Date Noted   Intractable nausea and vomiting 10/21/2024    Priority: High   Diarrhea 10/21/2024    Priority: High   Enteritis 10/21/2024    Priority: High   Hypomagnesemia 10/23/2024    Priority: Medium    Insomnia 10/21/2024    Priority: Low   GERD (gastroesophageal reflux disease) 10/21/2024    Priority: Low  Essential hypertension 10/21/2024   Spondylolisthesis at L4-L5 level 04/30/2024      Significant Events: Admitted 10/21/2024 Transferred to hospital at home 10/22/2024    Subjective / Interval 24 hour History:  Patient seen virtually during a.m. medic visit to the home this morning.   She reports feeling better today, no abdominal pain this morning.  She was able to tolerate some solid food yesterday evening, some chicken pot pie mostly the chicken and vegetables.  She denies any increased pain nausea or vomiting after that.  Did have a runny BM this morning but diarrhea has slowed down.  Stool sample was able to be obtained.  She denies fevers or chills.  She does request her IV to be removed if possible as it is uncomfortable.  We discussed transitioning IV antibiotic to oral and patient agreeable.  Otherwise no acute complaints today.  Feeling improved but also hopes to have an answer to the cause of her illness.     Admission Labs: CMP notable for glucose 104, creatinine 1.15, lipase 56 CBC normal Viral PCR's negative for COVID, flu A/B and RSV  Admission Imaging Studies: CT abdomen pelvis with contrast 12/9 -- several loops of mid-distal small bowel with abnormal wall thickening, adjacent fat stranding consistent with enteritis.  Terminal ileum spared.  CT Lumbar sping 12/9 -- s/pL4-5 fusion and bilateral posterolateral spinal fixation.  No acute findings. Patent spinal canal and neural foramina.  Significant Labs: Cr trend 1.15 >> 0.99 >> 1.02 Mg 1.2 on 12/11 (replaced) >> pending  Significant Imaging Studies: As above  Antibiotic Therapy: Anti-infectives (From admission, onward)    Start     Dose/Rate Route Frequency Ordered Stop   10/24/24 1800  cefadroxil (DURICEF) capsule 500 mg        500 mg Oral 2 times daily 10/24/24 1038 10/27/24 1959   10/22/24 2200  cefTRIAXone  (ROCEPHIN ) 2 g in sodium chloride  0.9 % 100 mL IVPB  Status:  Discontinued        2 g 200 mL/hr over 30 Minutes Intravenous Every 24 hours 10/22/24 1725 10/24/24 1038   10/22/24 1315  ciprofloxacin  (CIPRO ) tablet 500 mg  Status:  Discontinued        500 mg Oral 2 times daily 10/22/24 1221 10/22/24 1725   10/22/24 1315  metroNIDAZOLE  (FLAGYL ) tablet 500 mg        500 mg Oral Every 12 hours  10/22/24 1221         Procedures: none  Consultants: none   Physical Exam:    10/24/2024    9:00 AM 10/23/2024    9:20 PM 10/23/2024    8:46 PM  Vitals with BMI  Systolic 164 140 819  Diastolic 89 70 83  Pulse 61 63 83      General exam: awake, alert, no acute distress HEENT: hearing grossly normal  Respiratory system: on room air, normal respiratory effort. Lungs CTAB Cardiovascular system:  Gastrointestinal system: soft, mild tenderness, +bowel sounds. Central nervous system: A&O x 4. no gross focal neurologic deficits, normal speech Psychiatry: normal mood, congruent affect, judgement and insight appear normal    Data Reviewed:  See significant labs above   Family Communication:   Disposition: Status is: Inpatient Remains inpatient appropriate because: needs further improvement in enteritis symptoms, remains on IV therapies   Planned Discharge Destination: Home    Time spent: 45 minutes  Author: Burnard DELENA Cunning, DO [facility]  07/03/2024 7:00 AM  For on call review [email or web address].

## 2024-10-24 NOTE — Progress Notes (Signed)
 Arrived to morning visit   patient is alert and oriented   she states she feels some better than yesterday   she states she does have a stool sample for us    visit was done and medications given   patient seen virtual nurse and Dr Fausto  she states possible discharge for tomorrow   labs were needed   patient was stuck multiple times by the EMT and myself with no success   spoke to Lauraine about possibly being able to come and try   she said she could after her next visit   I told her that I would try one more time    that time was successful in getting a good flow to fill tubes  patient seemed satisfied to be discharged tomorrow   labs and stool sample were taken back to [facility] lab for drop off

## 2024-10-24 NOTE — Progress Notes (Signed)
 The pt was seen today for her night time Iv magnesium infusion. The pt was alert and oriented. Her skin was warm,dry and normal in color. She had a strong radial pulse and was breathing normally.   She stated she had a good day and denied any pain this time. State she was able to have some solid food tonight but, has not had any BM since earlier today.  Vitals were taken and are as noted.   During the administration her magnesium it was noted that her IV began to leak. Dressing was changed and a new saline lock placed x2 but, the pt's iv continued to leak and the pump was now saying that there was an occulusion. The pt's Iv was noted to be possibly infiltrated and no longer flushing. The iv was removed and a new one was started in her right wrist. Once the IV infusion was done a repeat blood pressure was taken manually and the pt stated that this reading was much closer to blood pressures obtained at the hospital.   The pt was given her PO antibiotic and asked if there was anything else she needed for the remainder of the night and she denied same. The pt was told to call back if anything were to change and she agreed she would. At this time the pt's visit was complete.

## 2024-10-24 NOTE — Progress Notes (Addendum)
 Pt seen for routine [facility] evening visit. Pt appears well and is walking around her home. Pt states she feels much better than she did earlier in the week when she was first admitted.   Vital signs and assessment obtained as noted. Pt does feel like she has slight swelling in her ankles. Same appeared minimal and was non-pitting.   Medications administered as noted.  IMM letter signed. Pt copy left at the home, our copy has been placed in shadow chart at hub. Additional copies left in binder in case a new one needs to be signed.   Pt encouraged to call back with any problems or questions.   IV pole and pump removed from home and brought back to hub at [facility].

## 2024-10-24 NOTE — Progress Notes (Signed)
 This EMT arrived to the home with EMTP [facility] for lab work and morning H@H  visit. Pt greeted this crew at the door and was in a very good mood. Pt stated that she had solid food for dinner last night and that it has not sat well with her stomach. Pt stated that she had a watery BM this AM and had caught the [facility] in a commode hat for collection. This EMT then collected the fecal sample and placed it into a sterile collection cup. The sample was then bagged appropriately. The pt's vital signs were then accessed and documented into the chart. This EMT attempted multiple times (X3) to obtain lab work on pt. EMTP [facility] then attempted multiple times to obtain labs. EMTP [facility] was able to obtain the labs after multiple attempts. EMTP [facility] then finished the morning H@H  visit. Labs and fecal sample were then transported to [facility] by [facility] and walked to the lab. Virtual RN McWhite was made aware of the many attempts to obtain labs at this time. Pt was not agitated or irritable at the situation. The pt was very eager to get the results of all the test that were taken this AM. Both this EMT and EMTP [facility] apologized to the pt for having to sick her so many times. The pt was understanding and relieved that this team was able to obtain the samples without having to be transported to [facility] for the testing. The pt was made aware that hopefully no more sticks were needed today. The pt was thankful and playful at this time. Pt reminded that if she needed anything to contact the team via tablet. Pt verbalized understandings at this time.

## 2024-10-25 LAB — PROCALCITONIN: Procalcitonin: <0.1 ng/mL

## 2024-10-25 MED ORDER — RISAQUAD PO CAPS
2.0000 | ORAL_CAPSULE | Freq: Every day | ORAL | Status: DC
  Administered 2024-10-25: 2 via ORAL
  Filled 2024-10-25 (×2): qty 2

## 2024-10-25 MED ORDER — ONDANSETRON 8 MG PO TBDP: 8.0000 mg | ORAL_TABLET | Freq: Three times a day (TID) | ORAL | 0 refills | Status: AC | PRN

## 2024-10-25 MED ORDER — RISAQUAD PO CAPS: 2.0000 | ORAL_CAPSULE | Freq: Every day | ORAL | 0 refills | Status: AC

## 2024-10-25 MED ORDER — ONDANSETRON 4 MG PO TBDP
8.0000 mg | ORAL_TABLET | Freq: Three times a day (TID) | ORAL | Status: DC | PRN
  Filled 2024-10-25 (×3): qty 2

## 2024-10-25 MED ORDER — AMLODIPINE BESYLATE 5 MG PO TABS: 5.0000 mg | ORAL_TABLET | Freq: Every evening | ORAL | 1 refills | Status: AC

## 2024-10-25 MED ORDER — LOSARTAN POTASSIUM 100 MG PO TABS: 100.0000 mg | ORAL_TABLET | Freq: Every day | ORAL | Status: AC

## 2024-10-25 MED ORDER — AMLODIPINE BESYLATE 5 MG PO TABS: 5.0000 mg | ORAL_TABLET | Freq: Every evening | ORAL | Status: DC

## 2024-10-25 MED ADMIN — Losartan Potassium Tab 50 MG: 100 mg | ORAL | NDC 68084034711

## 2024-10-25 NOTE — Progress Notes (Signed)
 Patient reports feeling better this morning after eating and showering. Patient was nauseous overnight but does not want zofran  at this time. Dry cough present. Patient states the coughing is normal during this time year and she will follow up with her PCP if needed.

## 2024-10-25 NOTE — Progress Notes (Signed)
 Called Carolyn Rangel this morning due to some desaturation alarms. She does not feel well. She has an upset stomach all night. No vomiting or diarrhea. Did have 1 loose BM this morning. She is developing a new cough and is disappointed that she is not feeling better. There were no immediate needs. I explained that I was d/w the team her concerns.

## 2024-10-25 NOTE — Discharge Summary (Addendum)
 [facility] Physician Discharge Summary   Patient: Carolyn Rangel MRN: [id number] DOB: Jun 03, 1956  Admit date:     10/21/2024  Discharge date: 10/25/2024  Discharge Physician: Burnard DELENA Cunning   PCP: Larnell Hamilton, MD   Patient identified themself as Carolyn Rangel  DOB 22-Jan-1956  Medic Barnie Bud was present in the home during encounter and performed the physical exam and assessment.  Patient was seen today via video chat; my physical location [place], [place]   Recommendations at discharge:   Follow up with Primary Care in 1-2 weeks Repeat CBC, BMP, Mg at follow up Follow up on patient's recovery from enteritis   Discharge Diagnoses: Principal Problem:   Intractable nausea and vomiting Active Problems:   Diarrhea   Enteritis   Hypomagnesemia   Insomnia   GERD (gastroesophageal reflux disease)   Essential hypertension  Resolved Problems:   * No resolved hospital problems. [facility] Course:  68 year old with history hepatic steatosis, status post cholecystectomy, HTN, GERD, insomnia presented to the ED on 10/21/2024 for evaluation of nausea vomiting and diarrhea.  Patient was admitted to the hospital with concerns of gastroenteritis.    Patient was started on supportive care with IV fluids, requiring IV pain medications for severe abdominal pain, IV antiemetics.  Stool studies were ordered to evaluate for infectious etiology of diarrhea.  10/22/24 - [facility] to [facility] having been transitioned of IV pain meds and fluids.  Tolerating clear >> full liquid diet.    10/23/24 - pt overall feels better, still drinking only liquids and not ready to attempt solid foods.  Belly pain better yesterday, recurred on and off overnight.  Few bouts of diarrhea overnight.  No fever/chills.  Mg 1.2 was replaced IV in the evening.   10/24/24 - patient reports no abdominal pain this morning, tolerated first small amount of solid food yesterday evening.   Diarrhea this morning but is improving.  Stool sample collected.  10/25/24 -patient had upset stomach much of the night, but feels much better this morning.  She felt it was similar to when she takes Cipro , and she had been given her first dose of a new p.o. antibiotic late afternoon yesterday shortly prior to onset.   Patient overall doing well and is medically stable and agreeable for discharge today and primary care follow-up for repeat labs in 1 to 2 weeks.     Assessment and Plan: * Intractable nausea and vomiting In the setting of gastroenteritis.   12/12 this is mostly resolved, patient reporting minimal intermittent nausea 12/13 upset stomach overnight recurrent but seems likely from oral antibiotic.  Patient feels much better this morning -- Continue as needed Zofran  and start probiotics  Enteritis Viral etiology is most likely.  Stool studies are pending.  Patient gradually improving but continues to have intermittent abdominal pain and diarrhea.  No fevers or chills. -- Patient has been treated with empiric antibiotics with IV Rocephin , p.o. Flagyl  --We transitioned IV Rocephin  to p.o. Duricef yesterday evening.  Patient having queasy upset stomach with this. Suspicion for bacterial etiology is low given lack of fevers and leukocytosis.  Procalcitonin also negative. --Discussed with patient and will DC antibiotics today --Patient advancing diet as tolerated and doing well with this --Pain control and antiemetics as needed Zofran  ODT prescription sent --Start probiotics --Follow-up with PCP in 1 to 2 weeks  Diarrhea In setting of gastroenteritis.  Improving C. difficile and GI panel were both negative. 12/13 -patient had  another loose BM this morning but diarrhea has improved significantly  Hypomagnesemia Likely from GI losses 12/11 - Mg level 1.2, replaced with 4 g IV Mg-sulfate 12/12 repeat Mg level 1.8 --Check Mg level at follow-up  GERD (gastroesophageal reflux  disease) -- On Protonix  40 mg daily   Insomnia Continue home temazepam   Essential hypertension 12/13: BP 148/83, mildly elevated -- Continue home amlodipine  5 mg, losartan  100 mg, Toprol -XL 100 mg -- PCP follow-up          Consultants: None Procedures performed: None Disposition: Home Diet recommendation:  Patient advancing diet as tolerated, recommend soft and bland solids  DISCHARGE MEDICATION: Allergies as of 10/25/2024       Reactions   Sulfa Antibiotics Anaphylaxis   Penicillins Hives, Itching   Simvastatin Other (See Comments)   Other reaction(s): Other (See Comments) Elevated LFT's Other Reaction(s): ELEVATED LFTs   Ciprofloxacin     Per patient: Dizziness and fatigue   Fluoxetine    Other reaction(s): Other (See Comments) feel strange Other Reaction(s): groggy/dizzy   Oxycodone  Other (See Comments)   Makes her Jittery   Penicillin G    Other Reaction(s): can tolerate cephalosporins   Pravastatin    Other reaction(s): Other (See Comments) joint pain Other Reaction(s): myalgias   Sulfamethoxazole-trimethoprim Swelling   Other reaction(s): Other (See Comments) itching        Medication List     STOP taking these medications    meloxicam 7.5 MG tablet Commonly known as: MOBIC   ondansetron  4 MG tablet Commonly known as: ZOFRAN        TAKE these medications    acidophilus Caps capsule Take 2 capsules by mouth daily. Start taking on: October 26, 2024   amLODipine  5 MG tablet Commonly known as: NORVASC  Take 1 tablet (5 mg total) by mouth every evening. What changed:  medication strength how much to take   COQ10 PO Take 300 mg by mouth daily.   cyclobenzaprine  10 MG tablet Commonly known as: FLEXERIL  Take 1 tablet (10 mg total) by mouth 3 (three) times daily as needed for muscle spasms.   HYDROcodone -acetaminophen  5-325 MG tablet Commonly known as: NORCO/VICODIN Take 2 tablets by mouth every 4 (four) hours as needed for severe  pain (pain score 7-10).   ibuprofen 200 MG tablet Commonly known as: ADVIL Take 200 mg by mouth every 6 (six) hours as needed for mild pain.   losartan  100 MG tablet Commonly known as: COZAAR  Take 1 tablet (100 mg total) by mouth daily. Start taking on: October 26, 2024 What changed: how much to take   metoprolol  succinate 100 MG 24 hr tablet Commonly known as: TOPROL -XL Take 100 mg by mouth daily.   ondansetron  8 MG disintegrating tablet Commonly known as: ZOFRAN -ODT Take 1 tablet (8 mg total) by mouth every 8 (eight) hours as needed for nausea or vomiting.   pantoprazole  40 MG tablet Commonly known as: PROTONIX  Take 40 mg by mouth daily.   Repatha  SureClick 140 MG/ML Soaj Generic drug: Evolocumab  Inject 140 mg into the skin every 14 (fourteen) days.   temazepam  15 MG capsule Commonly known as: RESTORIL  Take 15 mg by mouth at bedtime as needed.   Vitamin D3 50 MCG (2000 UT) capsule Take 2,000 Units by mouth daily.        Discharge Exam: Filed Weights   10/21/24 0805 10/23/24 0900  Weight: 77.1 kg 80.8 kg   General exam: awake, alert, no acute distress HEENT: Voice clear, wearing glasses, hearing grossly normal  Respiratory system: On room air with normal respiratory effort, nonproductive sounding cough    Gastrointestinal system: soft, NT, ND Central nervous system: A&O x. no gross focal neurologic deficits, normal speech Extremities: moves all, no edema Skin: Appears normal color in the face on video call Psychiatry: normal mood, congruent affect, judgement and insight appear normal   Condition at discharge: stable  The results of significant diagnostics from this hospitalization (including imaging, microbiology, ancillary and laboratory) are listed below for reference.   Imaging Studies: CT L-SPINE NO CHARGE Result Date: 10/21/2024 EXAM: CT OF THE LUMBAR SPINE WITHOUT CONTRAST 10/21/2024 09:35:11 AM TECHNIQUE: CT of the lumbar spine was performed  without the administration of intravenous contrast. Multiplanar reformatted images are provided for review. Automated exposure control, iterative reconstruction, and/or weight based adjustment of the mA/kV was utilized to reduce the radiation dose to as low as reasonably achievable. COMPARISON: None available. CLINICAL HISTORY: FINDINGS: BONES AND ALIGNMENT: Normal vertebral body heights. No acute fracture or suspicious bone lesion. Status post interbody fusion and bilateral posterolateral spinal fixation at L4-L5. There is slight anterolisthesis at the operative level. DEGENERATIVE CHANGES: The spinal canal and neural foramina appear patent. SOFT TISSUES: No acute abnormality. IMPRESSION: 1. Status post interbody fusion and bilateral posterolateral spinal fixation at L4-5 with slight anterolisthesis at the operative level. The spinal canal and neural foramina appear patent. Electronically signed by: Evalene Coho MD 10/21/2024 09:56 AM EST RP Workstation: [id number]   CT ABDOMEN PELVIS W CONTRAST Result Date: 10/21/2024 EXAM: CT ABDOMEN AND PELVIS WITH CONTRAST 10/21/2024 09:35:11 AM TECHNIQUE: CT of the abdomen and pelvis was performed with the administration of 100 mL of iohexol  (OMNIPAQUE ) 300 MG/ML solution. Multiplanar reformatted images are provided for review. Automated exposure control, iterative reconstruction, and/or weight-based adjustment of the mA/kV was utilized to reduce the radiation dose to as low as reasonably achievable. COMPARISON: CT of the abdomen and pelvis dated 03/03/2023. CLINICAL HISTORY: Bowel obstruction suspected. FINDINGS: LOWER CHEST: There is streaky atelectasis/scarring present dependently within the lower lobes bilaterally. LIVER: There is diffuse fatty infiltration of the liver. GALLBLADDER AND BILE DUCTS: The patient is status post cholecystectomy. No biliary ductal dilatation. SPLEEN: No acute abnormality. PANCREAS: No acute abnormality. ADRENAL GLANDS: No acute  abnormality. KIDNEYS, URETERS AND BLADDER: No stones in the kidneys or ureters. No hydronephrosis. No perinephric or periureteral stranding. Urinary bladder is unremarkable. GI AND BOWEL: Stomach demonstrates no acute abnormality. There is abnormal thickening of the wall of several loops of the mid to distal small bowel and there is stranding of the adjacent fat. The terminal ileum is spared. The proximal small bowel is nondilated. There is no bowel obstruction. PERITONEUM AND RETROPERITONEUM: No ascites. No free air. VASCULATURE: Aorta is normal in caliber. LYMPH NODES: There are a few shotty mesenteric lymph nodes. REPRODUCTIVE ORGANS: The patient is status post hysterectomy and bilateral salpingo-oophorectomy. BONES AND SOFT TISSUES: The patient is also status post interbody fusion and bilateral posterolateral spinal fixation at L4-L5 and left total hip arthroplasty. No acute osseous abnormality. No focal soft tissue abnormality. IMPRESSION: 1. Abnormal thickening of the wall of several loops of the mid to distal small bowel with adjacent fat stranding, concerning for enteritis. The terminal ileum is spared. 2. Nondilated proximal small bowel. No evidence of bowel obstruction. 3. Diffuse hepatic steatosis. Correlate with liver enzymes and metabolic risk factors. 4. Dependent subsegmental atelectasis/scar in the lower lobes. Electronically signed by: Evalene Coho MD 10/21/2024 09:55 AM EST RP Workstation: [id number]    Microbiology: Results  for orders placed or performed during the hospital encounter of 10/21/24  Resp panel by RT-PCR (RSV, Flu A&B, Covid) Anterior Nasal Swab     Status: None   Collection Time: 10/21/24  8:08 AM   Specimen: Anterior Nasal Swab  Result Value Ref Range Status   SARS Coronavirus 2 by RT PCR NEGATIVE NEGATIVE Final    Comment: (NOTE) SARS-CoV-2 target nucleic acids are NOT DETECTED.  The SARS-CoV-2 RNA is generally detectable in upper respiratory specimens during the  acute phase of infection. The lowest concentration of SARS-CoV-2 viral copies this assay can detect is 138 copies/mL. A negative result does not preclude SARS-Cov-2 infection and should not be used as the sole basis for treatment or other patient management decisions. A negative result may occur with  improper specimen collection/handling, submission of specimen other than nasopharyngeal swab, presence of viral mutation(s) within the areas targeted by this assay, and inadequate number of viral copies(<138 copies/mL). A negative result must be combined with clinical observations, patient history, and epidemiological information. The expected result is Negative.  Fact Sheet for Patients:  bloggercourse.com  Fact Sheet for Healthcare Providers:  seriousbroker.it  This test is no t yet approved or cleared by the [place]  FDA and  has been authorized for detection and/or diagnosis of SARS-CoV-2 by FDA under an Emergency Use Authorization (EUA). This EUA will remain  in effect (meaning this test can be used) for the duration of the COVID-19 declaration under Section 564(b)(1) of the Act, 21 U.S.C.section 360bbb-3(b)(1), unless the authorization is terminated  or revoked sooner.       Influenza A by PCR NEGATIVE NEGATIVE Final   Influenza B by PCR NEGATIVE NEGATIVE Final    Comment: (NOTE) The Xpert Xpress SARS-CoV-2/FLU/RSV plus assay is intended as an aid in the diagnosis of influenza from Nasopharyngeal swab specimens and should not be used as a sole basis for treatment. Nasal washings and aspirates are unacceptable for Xpert Xpress SARS-CoV-2/FLU/RSV testing.  Fact Sheet for Patients: bloggercourse.com  Fact Sheet for Healthcare Providers: seriousbroker.it  This test is not yet approved or cleared by the [place]  FDA and has been authorized for detection and/or  diagnosis of SARS-CoV-2 by FDA under an Emergency Use Authorization (EUA). This EUA will remain in effect (meaning this test can be used) for the duration of the COVID-19 declaration under Section 564(b)(1) of the Act, 21 U.S.C. section 360bbb-3(b)(1), unless the authorization is terminated or revoked.     Resp Syncytial Virus by PCR NEGATIVE NEGATIVE Final    Comment: (NOTE) Fact Sheet for Patients: bloggercourse.com  Fact Sheet for Healthcare Providers: seriousbroker.it  This test is not yet approved or cleared by the [place]  FDA and has been authorized for detection and/or diagnosis of SARS-CoV-2 by FDA under an Emergency Use Authorization (EUA). This EUA will remain in effect (meaning this test can be used) for the duration of the COVID-19 declaration under Section 564(b)(1) of the Act, 21 U.S.C. section 360bbb-3(b)(1), unless the authorization is terminated or revoked.  Performed at [facility], [address] [place], [place] [address]   Gastrointestinal Panel by PCR , Stool     Status: None   Collection Time: 10/22/24  5:51 PM   Specimen: Stool  Result Value Ref Range Status   Campylobacter species NOT DETECTED NOT DETECTED Final   Plesimonas shigelloides NOT DETECTED NOT DETECTED Final   Salmonella species NOT DETECTED NOT DETECTED Final   Yersinia enterocolitica NOT DETECTED NOT DETECTED Final  Vibrio species NOT DETECTED NOT DETECTED Final   Vibrio cholerae NOT DETECTED NOT DETECTED Final   Enteroaggregative E coli (EAEC) NOT DETECTED NOT DETECTED Final   Enteropathogenic E coli (EPEC) NOT DETECTED NOT DETECTED Final   Enterotoxigenic E coli (ETEC) NOT DETECTED NOT DETECTED Final   Shiga like toxin producing E coli (STEC) NOT DETECTED NOT DETECTED Final   Shigella/Enteroinvasive E coli (EIEC) NOT DETECTED NOT DETECTED Final   Cryptosporidium NOT DETECTED NOT DETECTED Final   Cyclospora  cayetanensis NOT DETECTED NOT DETECTED Final   Entamoeba histolytica NOT DETECTED NOT DETECTED Final   Giardia lamblia NOT DETECTED NOT DETECTED Final   Adenovirus F40/41 NOT DETECTED NOT DETECTED Final   Astrovirus NOT DETECTED NOT DETECTED Final   Norovirus GI/GII NOT DETECTED NOT DETECTED Final   Rotavirus A NOT DETECTED NOT DETECTED Final   Sapovirus (I, II, IV, and V) NOT DETECTED NOT DETECTED Final    Comment: Performed at [facility], [address] [place], [place] [address]  C Difficile Quick Screen w PCR reflex     Status: None   Collection Time: 10/22/24  5:51 PM   Specimen: STOOL  Result Value Ref Range Status   C Diff antigen NEGATIVE NEGATIVE Final   C Diff toxin NEGATIVE NEGATIVE Final   C Diff interpretation No C. difficile detected.  Final    Comment: Performed at [facility], [address] [place], [place] [address]    Labs: CBC: Recent Labs  Lab 10/21/24 0827 10/22/24 0535 10/24/24 1052  WBC 6.2 5.0 6.2  NEUTROABS 4.5  --   --   HGB 13.1 11.3* 12.7  HCT 39.6 36.8 38.2  MCV 98.0 107.3* 100.3*  PLT 266 229 293   Basic Metabolic Panel: Recent Labs  Lab 10/21/24 0827 10/22/24 0535 10/23/24 0930 10/24/24 1052 10/24/24 1053  NA 142 139 138 138  --   K 3.8 3.5 4.2 3.7  --   CL 106 104 104 102  --   CO2 22 26 24 27   --   GLUCOSE 104* 99 102* 106*  --   BUN 10 7* 5* <5*  --   CREATININE 1.15* 0.99 1.02* 0.96  --   CALCIUM 9.6 8.9 8.8* 8.7*  --   MG  --   --  1.2*  --  1.8  PHOS  --   --  3.2  --   --    Liver Function Tests: Recent Labs  Lab 10/21/24 0827  AST 27  ALT 24  ALKPHOS 110  BILITOT 0.7  PROT 6.3*  ALBUMIN  4.2   CBG: No results for input(s): GLUCAP in the last 168 hours.  Discharge time spent: greater than 30 minutes.  Signed: Burnard DELENA Cunning, DO [facility] 10/25/2024

## 2024-10-25 NOTE — Progress Notes (Signed)
 Communicated with patient via video tablet. Patient appears comfortable in the chair. AVS paperwork given to the patient. Discharge instructions discussed. Patients question answered to satisfaction. Patient agreeable with discharge plan.

## 2024-10-25 NOTE — Progress Notes (Signed)
 Arrived to patient for discharge   she is alert and oriented  vitals were stable  she was excited to be able to get back to her normal home life   patient went over discharge paper work with virtual nurse   she was advised that if she needed anything that she may have forgotten to ask about to call and let us  know   she expressed how well she was treated by everyone that had cared for her while she was at home   all equipment was packed up and removed from the home and all unused meds were collected to return to the hub with

## 2024-10-25 NOTE — Progress Notes (Signed)
 Arrived to patient   she is alert and oriented  she looks like she feels better this morning   she states she does have a cough but that is normal for this time of year   she states that she thinks the new antibiotic has made her feels bad  she states that she doesn't need a chest xray   she said she would contact her primary if needed for medication   all vitals are stable   all meds given with the exception of the antibiotic

## 2024-10-25 NOTE — TOC Transition Note (Signed)
 Transition of Care [facility]) - Discharge Note   Patient Details  Name: Carolyn Rangel MRN: [id number] Date of Birth: Oct 29, 1956  [facility] CM/SW Contact:  Corean JAYSON Canary, RN Phone Number: 10/25/2024, 9:52 AM   Clinical Narrative:     [name] is discharging fro the H@H  program.   No needs identified  Final next level of care: Home/Self Care Barriers to Discharge: No Barriers Identified   Patient Goals and CMS Choice            Discharge Placement                       Discharge Plan and Services Additional resources added to the After Visit Summary for                                       Social Drivers of Health (SDOH) Interventions SDOH Screenings   Food Insecurity: No Food Insecurity (10/21/2024)  Housing: Low Risk (10/21/2024)  Transportation Needs: No Transportation Needs (10/21/2024)  Utilities: Not At Risk (10/21/2024)  Social Connections: Moderately Integrated (10/21/2024)  Tobacco Use: Low Risk (10/21/2024)     Readmission Risk Interventions     No data to display

## 2024-10-29 ENCOUNTER — Inpatient Hospital Stay: Admission: RE | Admit: 2024-10-29 | Discharge: 2024-10-29 | Attending: Internal Medicine | Admitting: Internal Medicine

## 2024-10-29 DIAGNOSIS — Z1231 Encounter for screening mammogram for malignant neoplasm of breast: Secondary | ICD-10-CM

## 2024-11-04 ENCOUNTER — Other Ambulatory Visit: Payer: Self-pay | Admitting: Internal Medicine

## 2024-11-04 DIAGNOSIS — R928 Other abnormal and inconclusive findings on diagnostic imaging of breast: Secondary | ICD-10-CM

## 2024-11-21 ENCOUNTER — Ambulatory Visit
Admission: RE | Admit: 2024-11-21 | Discharge: 2024-11-21 | Disposition: A | Source: Ambulatory Visit | Attending: Internal Medicine | Admitting: Internal Medicine

## 2024-11-21 DIAGNOSIS — R928 Other abnormal and inconclusive findings on diagnostic imaging of breast: Secondary | ICD-10-CM
